# Patient Record
Sex: Female | Born: 1960 | Race: White | Hispanic: No | Smoking: Never smoker
Health system: Southern US, Community
[De-identification: ages and names within clinical notes are randomized; demographics above are authoritative.]

## PROBLEM LIST (undated history)

## (undated) DIAGNOSIS — M545 Low back pain, unspecified: Secondary | ICD-10-CM

## (undated) DIAGNOSIS — F32A Depression, unspecified: Secondary | ICD-10-CM

## (undated) DIAGNOSIS — J329 Chronic sinusitis, unspecified: Secondary | ICD-10-CM

## (undated) DIAGNOSIS — D219 Benign neoplasm of connective and other soft tissue, unspecified: Secondary | ICD-10-CM

## (undated) DIAGNOSIS — E7800 Pure hypercholesterolemia, unspecified: Secondary | ICD-10-CM

## (undated) DIAGNOSIS — F329 Major depressive disorder, single episode, unspecified: Secondary | ICD-10-CM

## (undated) DIAGNOSIS — R632 Polyphagia: Secondary | ICD-10-CM

## (undated) DIAGNOSIS — A64 Unspecified sexually transmitted disease: Secondary | ICD-10-CM

## (undated) DIAGNOSIS — N946 Dysmenorrhea, unspecified: Secondary | ICD-10-CM

## (undated) DIAGNOSIS — A6000 Herpesviral infection of urogenital system, unspecified: Secondary | ICD-10-CM

## (undated) DIAGNOSIS — N200 Calculus of kidney: Secondary | ICD-10-CM

## (undated) DIAGNOSIS — R102 Pelvic and perineal pain: Secondary | ICD-10-CM

## (undated) DIAGNOSIS — G4700 Insomnia, unspecified: Secondary | ICD-10-CM

## (undated) DIAGNOSIS — F419 Anxiety disorder, unspecified: Secondary | ICD-10-CM

## (undated) HISTORY — DX: Low back pain, unspecified: M54.50

## (undated) HISTORY — DX: Depression, unspecified: F32.A

## (undated) HISTORY — DX: Pure hypercholesterolemia, unspecified: E78.00

## (undated) HISTORY — DX: Insomnia, unspecified: G47.00

## (undated) HISTORY — DX: Herpesviral infection of urogenital system, unspecified: A60.00

## (undated) HISTORY — DX: Chronic sinusitis, unspecified: J32.9

## (undated) HISTORY — DX: Dysmenorrhea, unspecified: N94.6

## (undated) HISTORY — DX: Benign neoplasm of connective and other soft tissue, unspecified: D21.9

## (undated) HISTORY — DX: Anxiety disorder, unspecified: F41.9

## (undated) HISTORY — DX: Polyphagia: R63.2

## (undated) HISTORY — DX: Pelvic and perineal pain: R10.2

## (undated) HISTORY — DX: Calculus of kidney: N20.0

## (undated) HISTORY — DX: Unspecified sexually transmitted disease: A64

---
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
## 1979-08-22 DIAGNOSIS — A64 Unspecified sexually transmitted disease: Secondary | ICD-10-CM | POA: Insufficient documentation

## 1979-08-22 DIAGNOSIS — A6000 Herpesviral infection of urogenital system, unspecified: Secondary | ICD-10-CM | POA: Insufficient documentation

## 1979-08-22 HISTORY — DX: Unspecified sexually transmitted disease: A64

## 1979-08-22 HISTORY — DX: Herpesviral infection of urogenital system, unspecified: A60.00

## 1996-08-21 HISTORY — PX: BREAST EXCISIONAL BIOPSY: SUR124

## 1996-09-21 HISTORY — PX: BREAST SURGERY: SHX581

## 1998-10-06 ENCOUNTER — Other Ambulatory Visit: Admission: RE | Admit: 1998-10-06 | Discharge: 1998-10-06 | Payer: Self-pay | Admitting: *Deleted

## 1999-08-22 DIAGNOSIS — N200 Calculus of kidney: Secondary | ICD-10-CM

## 1999-08-22 HISTORY — DX: Calculus of kidney: N20.0

## 1999-09-19 ENCOUNTER — Encounter: Payer: Self-pay | Admitting: Obstetrics and Gynecology

## 1999-09-19 ENCOUNTER — Ambulatory Visit (HOSPITAL_COMMUNITY): Admission: RE | Admit: 1999-09-19 | Discharge: 1999-09-19 | Payer: Self-pay | Admitting: Obstetrics and Gynecology

## 1999-11-17 ENCOUNTER — Other Ambulatory Visit: Admission: RE | Admit: 1999-11-17 | Discharge: 1999-11-17 | Payer: Self-pay | Admitting: *Deleted

## 2000-05-12 ENCOUNTER — Encounter: Payer: Self-pay | Admitting: Emergency Medicine

## 2000-05-12 ENCOUNTER — Emergency Department (HOSPITAL_COMMUNITY): Admission: EM | Admit: 2000-05-12 | Discharge: 2000-05-12 | Payer: Self-pay | Admitting: Emergency Medicine

## 2000-05-22 ENCOUNTER — Encounter: Admission: RE | Admit: 2000-05-22 | Discharge: 2000-05-22 | Payer: Self-pay | Admitting: Urology

## 2000-05-22 ENCOUNTER — Encounter: Payer: Self-pay | Admitting: Urology

## 2000-09-26 ENCOUNTER — Encounter: Payer: Self-pay | Admitting: *Deleted

## 2000-09-26 ENCOUNTER — Ambulatory Visit (HOSPITAL_COMMUNITY): Admission: RE | Admit: 2000-09-26 | Discharge: 2000-09-26 | Payer: Self-pay | Admitting: *Deleted

## 2000-12-05 ENCOUNTER — Other Ambulatory Visit: Admission: RE | Admit: 2000-12-05 | Discharge: 2000-12-05 | Payer: Self-pay | Admitting: *Deleted

## 2001-09-13 ENCOUNTER — Emergency Department (HOSPITAL_COMMUNITY): Admission: EM | Admit: 2001-09-13 | Discharge: 2001-09-13 | Payer: Self-pay | Admitting: Emergency Medicine

## 2002-02-12 ENCOUNTER — Other Ambulatory Visit: Admission: RE | Admit: 2002-02-12 | Discharge: 2002-02-12 | Payer: Self-pay | Admitting: Obstetrics and Gynecology

## 2003-07-10 ENCOUNTER — Ambulatory Visit (HOSPITAL_COMMUNITY): Admission: RE | Admit: 2003-07-10 | Discharge: 2003-07-10 | Payer: Self-pay | Admitting: Obstetrics and Gynecology

## 2005-10-27 ENCOUNTER — Ambulatory Visit (HOSPITAL_COMMUNITY): Admission: RE | Admit: 2005-10-27 | Discharge: 2005-10-27 | Payer: Self-pay | Admitting: Obstetrics and Gynecology

## 2005-10-31 ENCOUNTER — Other Ambulatory Visit: Admission: RE | Admit: 2005-10-31 | Discharge: 2005-10-31 | Payer: Self-pay | Admitting: Obstetrics and Gynecology

## 2007-09-05 ENCOUNTER — Ambulatory Visit (HOSPITAL_COMMUNITY): Admission: RE | Admit: 2007-09-05 | Discharge: 2007-09-05 | Payer: Self-pay | Admitting: Obstetrics and Gynecology

## 2007-09-06 ENCOUNTER — Other Ambulatory Visit: Admission: RE | Admit: 2007-09-06 | Discharge: 2007-09-06 | Payer: Self-pay | Admitting: Obstetrics and Gynecology

## 2008-09-07 ENCOUNTER — Other Ambulatory Visit: Admission: RE | Admit: 2008-09-07 | Discharge: 2008-09-07 | Payer: Self-pay | Admitting: Obstetrics and Gynecology

## 2010-11-29 ENCOUNTER — Other Ambulatory Visit: Payer: Self-pay | Admitting: Obstetrics and Gynecology

## 2010-11-29 DIAGNOSIS — Z1231 Encounter for screening mammogram for malignant neoplasm of breast: Secondary | ICD-10-CM

## 2010-12-08 ENCOUNTER — Ambulatory Visit (HOSPITAL_COMMUNITY)
Admission: RE | Admit: 2010-12-08 | Discharge: 2010-12-08 | Disposition: A | Discharge status: Home / Self Care | Payer: BLUE CROSS/BLUE SHIELD | Source: Ambulatory Visit | Attending: Obstetrics and Gynecology | Admitting: Obstetrics and Gynecology

## 2010-12-08 DIAGNOSIS — Z1231 Encounter for screening mammogram for malignant neoplasm of breast: Secondary | ICD-10-CM | POA: Insufficient documentation

## 2010-12-08 LAB — HM MAMMOGRAPHY: HM Mammogram: NEGATIVE

## 2011-05-22 HISTORY — PX: COLONOSCOPY: SHX174

## 2012-12-05 ENCOUNTER — Ambulatory Visit: Payer: Self-pay | Admitting: Nurse Practitioner

## 2012-12-17 ENCOUNTER — Encounter: Payer: Self-pay | Admitting: *Deleted

## 2012-12-19 ENCOUNTER — Ambulatory Visit (INDEPENDENT_AMBULATORY_CARE_PROVIDER_SITE_OTHER): Payer: BC Managed Care – PPO | Admitting: Nurse Practitioner

## 2012-12-19 ENCOUNTER — Encounter: Payer: Self-pay | Admitting: Nurse Practitioner

## 2012-12-19 ENCOUNTER — Other Ambulatory Visit: Payer: Self-pay

## 2012-12-19 VITALS — BP 116/68 | HR 64 | Ht 64.0 in | Wt 158.0 lb

## 2012-12-19 DIAGNOSIS — Z1231 Encounter for screening mammogram for malignant neoplasm of breast: Secondary | ICD-10-CM

## 2012-12-19 DIAGNOSIS — Z01419 Encounter for gynecological examination (general) (routine) without abnormal findings: Secondary | ICD-10-CM

## 2012-12-19 DIAGNOSIS — B009 Herpesviral infection, unspecified: Secondary | ICD-10-CM

## 2012-12-19 MED ORDER — ACYCLOVIR 400 MG PO TABS: 400.0000 mg | ORAL_TABLET | Freq: Two times a day (BID) | ORAL | Status: DC

## 2012-12-19 MED ORDER — DESOGESTREL-ETHINYL ESTRADIOL 0.15-0.02/0.01 MG (21/5) PO TABS: 1.0000 | ORAL_TABLET | Freq: Every day | ORAL | Status: DC

## 2012-12-19 NOTE — Progress Notes (Signed)
52 y.o. Married- Separated Caucasian Fe here for annual exam.  Menses is usually 5 days and light. No cramps on OCP. She missed 3 pills this past pack and decided to stop OCP.  Waiting on next menses to restart OCP.  If no menses to call back. Still a lot of stress, divorce is pending. No new partner.  Patient's last menstrual period was 10/25/2012.          Sexually active: yes  The current method of family planning is OCP (estrogen/progesterone).    Exercising: yes  Home exercise routine includes walking 3 hrs per week. Smoker:  no  Health Maintenance: Pap:  11/30/2011 normal with Neg HR HPV MMG:  12/08/2010 pt. Will schedule Colonoscopy:  05/2011 normal recheck in 10 years. [facility] [place] BMD:   never Tdap:  2006 documented, and maybe repeated since then. Labs: PCP does UA and blood work    reports that she has never smoked. She has never used smokeless tobacco. She reports that she does not use illicit drugs.  Past Medical History  Diagnosis Date  . Dysmenorrhea   . 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 Pelvic pain   . Kidney stones 2001    Past Surgical History  Procedure Laterality Date  . 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 Colonoscopy  05/2011    Current Outpatient Prescriptions  Medication Sig Dispense Refill  . acyclovir (ZOVIRAX) 400 MG tablet Take 400 mg by mouth 2 (two) times daily.      . Cholecalciferol (VITAMIN D) 1000 UNITS capsule Take 1,000 Units by mouth daily.      Marland Kitchen desogestrel-ethinyl estradiol (KARIVA) 0.15-0.02/0.01 MG (21/5) tablet Take 1 tablet by mouth daily.      . Flaxseed, Linseed, 1000 MG CAPS Take by mouth daily.      . Multiple Vitamins-Minerals (MULTIVITAMIN PO) Take by mouth daily.      . Omega-3 Fatty Acids (OMEGA 3 PO) Take by mouth daily.      Marland Kitchen aspirin 81 MG tablet Take 81 mg by mouth daily.      . sertraline (ZOLOFT) 50 MG tablet Take 50 mg by mouth every other day.      . zolpidem (AMBIEN)  10 MG tablet Take 10 mg by mouth at bedtime as needed for sleep.       No current facility-administered medications for this visit.    Family History  Problem Relation Age of Onset  . Diabetes Mother   . Heart failure Father   . Diabetes Maternal Grandmother     ROS:  Pertinent items are noted in HPI.  Otherwise, a comprehensive ROS was negative.  Exam:   BP 116/68  Pulse 64  Ht 5\' 4"  (1.626 m)  Wt 158 lb (71.668 kg)  BMI 27.11 kg/m2  LMP 10/25/2012 Height: 5\' 4"  (162.6 cm)  Ht Readings from Last 3 Encounters:  12/19/12 5\' 4"  (1.626 m)    General appearance: alert, cooperative and appears stated age Head: Normocephalic, without obvious abnormality, atraumatic Neck: no adenopathy, supple, symmetrical, trachea midline and thyroid normal to inspection and palpation Lungs: clear to auscultation bilaterally Breasts: normal appearance, no masses or tenderness Heart: regular rate and rhythm Abdomen: soft, non-tender; no masses,  no organomegaly Extremities: extremities normal, atraumatic, no cyanosis or edema Skin: Skin color, texture, turgor normal. No rashes or lesions Lymph nodes: Cervical, supraclavicular, and axillary nodes normal. No abnormal inguinal nodes palpated Neurologic: Grossly normal  Pelvic: External genitalia:  no lesions              Urethra:  normal appearing urethra with no masses, tenderness or lesions              Bartholin's and Skene's: normal                 Vagina: normal appearing vagina with normal color and discharge, no lesions              Cervix: anteverted              Pap taken: no Bimanual Exam:  Uterus:  normal size, contour, position, consistency, mobility, non-tender              Adnexa: no mass, fullness, tenderness               Rectovaginal: Confirms               Anus:  normal sphincter tone, no lesions  A:  Well Woman with normal exam  History of Dysmenorrhea - on oral contraceptives  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

## 2012-12-19 NOTE — Patient Instructions (Addendum)

## 2012-12-22 NOTE — Progress Notes (Signed)
Encounter reviewed by Dr. Aruna Nestler Silva.  

## 2013-01-29 ENCOUNTER — Ambulatory Visit
Admission: RE | Admit: 2013-01-29 | Discharge: 2013-01-29 | Disposition: A | Discharge status: Home / Self Care | Payer: BC Managed Care – PPO | Source: Ambulatory Visit

## 2013-01-29 DIAGNOSIS — Z1231 Encounter for screening mammogram for malignant neoplasm of breast: Secondary | ICD-10-CM

## 2013-12-01 ENCOUNTER — Telehealth: Payer: Self-pay | Admitting: Nurse Practitioner

## 2013-12-01 NOTE — Telephone Encounter (Signed)
This is the year at AEX that she would be taken off OCP secondary to her age.  At this point she can come off and if no menses - which is expected we would document that.  Then in 3 months if no menses we would do a Provera challenge.  If in the interim off OCP if she gets vaso symptoms (that OTC [place] would not help) then may need discussion about HRT. Not sure how she feels about HRT.  But we have to have her off OCP before lab test would be accurate to test for menopause.

## 2013-12-01 NOTE — Telephone Encounter (Signed)
Routed to triage 

## 2013-12-01 NOTE — Telephone Encounter (Signed)
Patty, would you like me to offer this patient an office visit with you to discuss continuation of birth control prior to any further refills past annual exam?

## 2013-12-01 NOTE — Telephone Encounter (Signed)
Shelby Barnes--patient is requesting another RX for refill to last three months past her aex due date. She is getting ready to switch jobs and will not have insurance for three months. The patient is due after 12/19/13 but will not have insurance at that time. Please advise?  [facility] [address] [facility] [address]

## 2013-12-02 NOTE — Telephone Encounter (Signed)
Spoke with patient and she is agreeable to plan. She will finish the tablets that she has at this time and then dc. She will call with menses or if no menses in next 3 months, then schedule AEX with Patty.  Routing to provider for final review. Patient agreeable to disposition. Will close encounter

## 2013-12-22 ENCOUNTER — Ambulatory Visit: Payer: BC Managed Care – PPO | Admitting: Nurse Practitioner

## 2014-04-06 ENCOUNTER — Encounter: Payer: Self-pay | Admitting: Nurse Practitioner

## 2014-04-06 ENCOUNTER — Ambulatory Visit (INDEPENDENT_AMBULATORY_CARE_PROVIDER_SITE_OTHER): Payer: BC Managed Care – PPO | Admitting: Nurse Practitioner

## 2014-04-06 VITALS — BP 128/82 | HR 60 | Ht 64.25 in | Wt 182.0 lb

## 2014-04-06 DIAGNOSIS — Z0000 Encounter for general adult medical examination without abnormal findings: Secondary | ICD-10-CM

## 2014-04-06 DIAGNOSIS — N912 Amenorrhea, unspecified: Secondary | ICD-10-CM

## 2014-04-06 DIAGNOSIS — B009 Herpesviral infection, unspecified: Secondary | ICD-10-CM

## 2014-04-06 DIAGNOSIS — Z01419 Encounter for gynecological examination (general) (routine) without abnormal findings: Secondary | ICD-10-CM

## 2014-04-06 DIAGNOSIS — N3000 Acute cystitis without hematuria: Secondary | ICD-10-CM

## 2014-04-06 LAB — POCT URINALYSIS DIPSTICK
BILIRUBIN UA: NEGATIVE
Glucose, UA: NEGATIVE
KETONES UA: NEGATIVE
NITRITE UA: NEGATIVE
PH UA: 6
Protein, UA: NEGATIVE
RBC UA: NEGATIVE
Urobilinogen, UA: NEGATIVE

## 2014-04-06 MED ORDER — MEDROXYPROGESTERONE ACETATE 10 MG PO TABS: 10.0000 mg | ORAL_TABLET | Freq: Every day | ORAL | Status: DC

## 2014-04-06 MED ORDER — NITROFURANTOIN MONOHYD MACRO 100 MG PO CAPS: 100.0000 mg | ORAL_CAPSULE | Freq: Two times a day (BID) | ORAL | Status: DC

## 2014-04-06 MED ORDER — ACYCLOVIR 400 MG PO TABS: 400.0000 mg | ORAL_TABLET | Freq: Two times a day (BID) | ORAL | Status: DC

## 2014-04-06 NOTE — Patient Instructions (Signed)

## 2014-04-06 NOTE — Progress Notes (Signed)
53 y.o. G0P0 Divorced Caucasian Fe here for annual exam.  She went off OCP in May secondary to age and loss of insurance. No menses since then.  She does have an intermittent right mid abdomen to flank pain.  She initially thought this may be related to menses or right renal calculi.  She has had history of right renal calculi in the past.   But she also has a history of right uterine  fibroid found on PUS 1995.  No GI symptoms.  No vaso symptoms. Going out with friends. Since the divorce she has been more upset and therefore more outbreaks of HSV.  She also has a new job but she really like her position.  Patient's last menstrual period was 12/19/2013.          Sexually active: No.  The current method of family planning is none.    Exercising: Yes.    Home exercise routine includes yoga. Smoker:  no  Health Maintenance: Pap:  11/30/2011 normal with HR HPV MMG:  01/29/2013 BI-Rads neg  Will schedule Colonoscopy:  05/2011 WNL recheck in 10 years TDaP:  2 year per pt (PCP) Labs: PCP    UA: WBC Large ph: 6.0   reports that she has never smoked. She has never used smokeless tobacco. She reports that she does not use illicit drugs.  Past Medical History  Diagnosis Date  . Dysmenorrhea   . 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 Pelvic pain   . Kidney stones 2001  . Genital HSV 1981    Past Surgical History  Procedure Laterality Date  . Colonoscopy  05/2011  . 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 acyclovir (ZOVIRAX) 400 MG tablet Take 1 tablet (400 mg total) by mouth 2 (two) times daily.  180 tablet  3  . aspirin 81 MG tablet Take 81 mg by mouth daily.      . Cholecalciferol (VITAMIN D) 1000 UNITS capsule Take 1,000 Units by mouth daily.      . Omega-3 Fatty Acids (OMEGA 3 PO) Take by mouth daily.      . medroxyPROGESTERone (PROVERA) 10 MG tablet Take 1 tablet (10 mg total) by mouth daily.  10  tablet  0  . nitrofurantoin, macrocrystal-monohydrate, (MACROBID) 100 MG capsule Take 1 capsule (100 mg total) by mouth 2 (two) times daily.  14 capsule  0   No current facility-administered medications for this visit.    Family History  Problem Relation Age of Onset  . Diabetes Mother 79    Whipple Procedure from cyst on pancrease  . Heart failure Father   . Diabetes Maternal Grandmother   . Breast cancer Maternal Aunt 70    with metasis  . Stroke Paternal Grandmother   . Heart failure Paternal Grandfather     ROS:  Pertinent items are noted in HPI.  Otherwise, a comprehensive ROS was negative.  Exam:   BP 128/82  Pulse 60  Ht 5' 4.25" (1.632 m)  Wt 182 lb (82.555 kg)  BMI 31.00 kg/m2  LMP 12/19/2013 Height: 5' 4.25" (163.2 cm)  Ht Readings from Last 3 Encounters:  04/06/14 5' 4.25" (1.632 m)  12/19/12 5\' 4"  (1.626 m)    General appearance: alert, cooperative and appears stated age Head: Normocephalic, without obvious abnormality, atraumatic Neck: no adenopathy, supple, symmetrical, trachea midline and thyroid normal to inspection and palpation Lungs: clear  to auscultation bilaterally Breasts: normal appearance, no masses or tenderness Heart: regular rate and rhythm Abdomen: soft, non-tender; no masses,  no organomegaly Extremities: extremities normal, atraumatic, no cyanosis or edema Skin: Skin color, texture, turgor normal. No rashes or lesions Lymph nodes: Cervical, supraclavicular, and axillary nodes normal. No abnormal inguinal nodes palpated Neurologic: Grossly normal   Pelvic: External genitalia:  no lesions              Urethra:  normal appearing urethra with no masses, tenderness or lesions              Bartholin's and Skene's: normal                 Vagina: normal appearing vagina with normal color and discharge, no lesions              Cervix: anteverted              Pap taken: No. Bimanual Exam:  Uterus:  normal size, contour, position, consistency,  mobility, non-tender              Adnexa: no mass, fullness, tenderness               Rectovaginal: Confirms               Anus:  normal sphincter tone, no lesions  A:  Well Woman with normal exam  Postmenopausal ?  Right abdomen pain with abnormal urine  - R/O UTI as the cause  History of renal calculi and uterine fibroid  History of HSV  P:   Reviewed health and wellness pertinent to exam  Pap smear not taken today  Mammogram is due now and will schedule  Will follow with urine culture  Will for a Provera challenge 10 mg for 10 days and to report any bleeding  Will check an [facility] and follow  If the right abdominal pain does not go away and persist - may get PUS to evaluate  Counseled on breast self exam, mammography screening, adequate intake of calcium and vitamin D, diet and exercise return annually or prn  An After Visit Summary was printed and given to the patient.

## 2014-04-07 ENCOUNTER — Telehealth: Payer: Self-pay

## 2014-04-07 LAB — URINE CULTURE: Colony Count: 40000

## 2014-04-07 LAB — URINALYSIS, MICROSCOPIC ONLY
BACTERIA UA: NONE SEEN
Casts: NONE SEEN
Crystals: NONE SEEN

## 2014-04-07 NOTE — Telephone Encounter (Signed)
Pt informed and will call the office if she has any bleeding Encounter closed

## 2014-04-07 NOTE — Telephone Encounter (Signed)
Pt left appt yesterday with doing her lab work. Called pt to let her know. Pt is getting an [place] done but already took one pill last night. Per Ms. Shelby Barnes, pt will do lab work after she finish rx and if there is any break through bleeding.   LMOM for pt to call back.

## 2014-04-08 ENCOUNTER — Telehealth: Payer: Self-pay

## 2014-04-08 NOTE — Telephone Encounter (Signed)
Message copied by Gerda Diss on Wed Apr 08, 2014 11:14 AM ------      Message from: Kem Boroughs R      Created: Tue Apr 07, 2014 11:28 PM       Let patient know that urine culture had multiple bacteria that suggest vaginal contamination.  Continue to monitor symptoms and if any symptoms persist to call back. ------

## 2014-04-08 NOTE — Telephone Encounter (Signed)
Pt informed of results and voiced understanding Encounter closed

## 2014-04-12 NOTE — Progress Notes (Signed)
Encounter reviewed by Dr. Brook Silva.  

## 2014-05-18 ENCOUNTER — Ambulatory Visit (HOSPITAL_COMMUNITY)
Admission: RE | Admit: 2014-05-18 | Discharge: 2014-05-18 | Disposition: A | Discharge status: Home / Self Care | Payer: BC Managed Care – PPO | Source: Ambulatory Visit | Attending: Internal Medicine | Admitting: Internal Medicine

## 2014-05-18 ENCOUNTER — Encounter (HOSPITAL_COMMUNITY): Payer: Self-pay

## 2014-05-18 ENCOUNTER — Other Ambulatory Visit (HOSPITAL_COMMUNITY): Payer: Self-pay | Admitting: Internal Medicine

## 2014-05-18 DIAGNOSIS — R509 Fever, unspecified: Secondary | ICD-10-CM

## 2014-05-18 DIAGNOSIS — R109 Unspecified abdominal pain: Secondary | ICD-10-CM

## 2014-05-18 DIAGNOSIS — R1031 Right lower quadrant pain: Secondary | ICD-10-CM | POA: Insufficient documentation

## 2014-05-18 DIAGNOSIS — R10813 Right lower quadrant abdominal tenderness: Secondary | ICD-10-CM

## 2014-05-18 MED ORDER — IOHEXOL 300 MG/ML  SOLN
80.0000 mL | Freq: Once | INTRAMUSCULAR | Status: AC | PRN
  Administered 2014-05-18: 80 mL via INTRAVENOUS

## 2014-05-20 ENCOUNTER — Telehealth: Payer: Self-pay | Admitting: Nurse Practitioner

## 2014-05-20 NOTE — Telephone Encounter (Signed)
Spoke with patient. Patient states that she was seen last month for her aex and had a UTI. Patient was having slight pain on her right side when she came in for that appointment. States that she had her urine checked at PCP and still had UTI was placed on Cipro. Patient has been having increased right sided pain. Was seen on Monday for CT scan which only showed fibroid which patient states she has had for a while. Denies any bleeding. Patient states that pain is intermittent and feels more like an "intense pressure." Patient was previously on OCP but was taken off at last aex. Patient would like to know if this could be causing pain. Advised will need to be seen in office for evaluation so further recommendations can be made. Patient is agreeable. Appointment scheduled for tomorrow at 10am with Regina Eck CNM. Patient agreeable to date and time. Patient aware if pain increases or symptoms change or increase will need to be seen for immediate care. Patient agreeable.  Regina Eck CNM  Cc: Milford Cage, FNP

## 2014-05-20 NOTE — Telephone Encounter (Signed)
Pt wants to talk with the nurse no information given. °

## 2014-05-21 ENCOUNTER — Ambulatory Visit (INDEPENDENT_AMBULATORY_CARE_PROVIDER_SITE_OTHER): Payer: BC Managed Care – PPO | Admitting: Certified Nurse Midwife

## 2014-05-21 ENCOUNTER — Encounter: Payer: Self-pay | Admitting: Certified Nurse Midwife

## 2014-05-21 VITALS — BP 110/70 | HR 72 | Temp 98.2°F | Resp 16 | Ht 64.25 in | Wt 178.0 lb

## 2014-05-21 DIAGNOSIS — Z8742 Personal history of other diseases of the female genital tract: Secondary | ICD-10-CM

## 2014-05-21 DIAGNOSIS — N390 Urinary tract infection, site not specified: Secondary | ICD-10-CM

## 2014-05-21 DIAGNOSIS — Z86018 Personal history of other benign neoplasm: Secondary | ICD-10-CM

## 2014-05-21 DIAGNOSIS — R102 Pelvic and perineal pain: Secondary | ICD-10-CM

## 2014-05-21 DIAGNOSIS — N852 Hypertrophy of uterus: Secondary | ICD-10-CM

## 2014-05-21 LAB — POCT URINALYSIS DIPSTICK
Bilirubin, UA: NEGATIVE
Blood, UA: NEGATIVE
GLUCOSE UA: NEGATIVE
Ketones, UA: NEGATIVE
Leukocytes, UA: NEGATIVE
Nitrite, UA: NEGATIVE
Protein, UA: NEGATIVE
Urobilinogen, UA: NEGATIVE
pH, UA: 5

## 2014-05-21 NOTE — Patient Instructions (Signed)
Fibroids Fibroids are lumps (tumors) that can occur any place in a woman's body. These lumps are not cancerous. Fibroids vary in size, weight, and where they grow. HOME CARE  Do not take aspirin.  Write down the number of pads or tampons you use during your period. Tell your doctor. This can help determine the best treatment for you. GET HELP RIGHT AWAY IF:  You have pain in your lower belly (abdomen) that is not helped with medicine.  You have cramps that are not helped with medicine.  You have more bleeding between or during your period.  You feel lightheaded or pass out (faint).  Your lower belly pain gets worse. MAKE SURE YOU:  Understand these instructions.  Will watch your condition.  Will get help right away if you are not doing well or get worse. Document Released: 09/09/2010 Document Revised: 10/30/2011 Document Reviewed: 09/09/2010 [facility] Patient Information 2015 [place], [place]. This information is not intended to replace advice given to you by your health care provider. Make sure you discuss any questions you have with your health care provider. Abdominal Pain Many things can cause abdominal pain. Usually, abdominal pain is not caused by a disease and will improve without treatment. It can often be observed and treated at home. Your health care provider will do a physical exam and possibly order blood tests and X-rays to help determine the seriousness of your pain. However, in many cases, more time must pass before a clear cause of the pain can be found. Before that point, your health care provider may not know if you need more testing or further treatment. HOME CARE INSTRUCTIONS  Monitor your abdominal pain for any changes. The following actions may help to alleviate any discomfort you are experiencing:  Only take over-the-counter or prescription medicines as directed by your health care provider.  Do not take laxatives unless directed to do so by your health care  provider.  Try a clear liquid diet (broth, tea, or water) as directed by your health care provider. Slowly move to a bland diet as tolerated. SEEK MEDICAL CARE IF:  You have unexplained abdominal pain.  You have abdominal pain associated with nausea or diarrhea.  You have pain when you urinate or have a bowel movement.  You experience abdominal pain that wakes you in the night.  You have abdominal pain that is worsened or improved by eating food.  You have abdominal pain that is worsened with eating fatty foods.  You have a fever. SEEK IMMEDIATE MEDICAL CARE IF:   Your pain does not go away within 2 hours.  You keep throwing up (vomiting).  Your pain is felt only in portions of the abdomen, such as the right side or the left lower portion of the abdomen.  You pass bloody or black tarry stools. MAKE SURE YOU:  Understand these instructions.   Will watch your condition.   Will get help right away if you are not doing well or get worse.  Document Released: 05/17/2005 Document Revised: 08/12/2013 Document Reviewed: 04/16/2013 [facility] Patient Information 2015 [place], [place]. This information is not intended to replace advice given to you by your health care provider. Make sure you discuss any questions you have with your health care provider.

## 2014-05-21 NOTE — Progress Notes (Signed)
53 y.o. Divorced white female  G0P0 here for complaint of pelvic pressure and pain that started 5 days was seen on Monday by PCP and treated for UTI with Cipro and was seen again 05/20/14  by PCP and had CT scan which only showed known fibroid. Continues to have pain in area of right. Patient also had blood work with PCP all normal except potassium.  Pain is primarily located right mid quadrant and pain is felt more with bending. and is described as off and on. Pressure pain has almost resolved since starting Cipro..  Pain is aggravated by nothing and is not associated with anything else. Patient has been taking Metamucil to help with stool regulation, but no problems with. Patient also stopped her OCP's 4 months ago and feels this may have contributed also but no cramping experienced. Denies any vaginal bleeding or vaginal dryness.  Patient is not sexually active.   ROS:  Nausea:  No.  Fever:  No., but had a fever 5 days ago prior to PCP visit, none since  Weight loss/gain:  no  Vaginal discharge or odor:  no  Other:  Back pain earlier in the week, none now  All other ROS questions are negative except as per HPI.  Exam:   BP 110/70  Pulse 72  Temp(Src) 98.2 F (36.8 C) (Oral)  Resp 16  Ht 5' 4.25" (1.632 m)  Wt 178 lb (80.74 kg)  BMI 30.31 kg/m2  LMP 12/19/2013 General appearance: alert, cooperative, appears stated age and no distress Skin: warn and dry CVAT: negative Abdomen:  soft, non-tender; bowel sounds normal; no masses,  no organomegaly and no rebound, or point pain noted negative suprapubic Lymph:  no enlarged inguinal lymph nodes   Pelvic: External genitalia:  no lesions, no redness or tenderness              Urethra: normal appearing urethra with no masses, tenderness or lesions              Bartholins and Skenes: Bartholin's, Urethra, Skene's normal                 Vagina: normal appearing vagina with normal color and discharge, no lesions, non tender              Cervix:  normal appearance and non tender              Pap taken: No. Bimanual Exam:  Uterus:  enlarged to 10 week's size, non tender, tilted to left                               Adnexa:    normal adnexa in size, nontender and no masses, and left not palpated well                               Rectovaginal: Confirms                               Anus: normal appearance Declines POCT urine    A: UTI under treatment with Cipro per PCP CT scan within past 24 hours, negative except for uterine fibroid Normal abdominal exam, no point of pain identified  Enlarged uterus known fibroid  P:Discussed findings with patient and feel  UTI the initial problem due to pressure and pain has decreased  and negative CT. Would still recommend PUS to assess fibroid and adnexa. Dr. Quincy Simmonds agrees with this plan, per consult. Warning signs of pelvic and abdominal pain given and need to advise. Patient feels this is a good plan and will advise if any change in status.   Rv prn, as above    An After Visit Summary was printed and given to the patient.

## 2014-05-24 NOTE — Progress Notes (Signed)
Encounter reviewed by Dr. Brook Silva.  

## 2014-05-26 ENCOUNTER — Telehealth: Payer: Self-pay | Admitting: Certified Nurse Midwife

## 2014-05-26 DIAGNOSIS — N852 Hypertrophy of uterus: Secondary | ICD-10-CM

## 2014-05-26 DIAGNOSIS — D259 Leiomyoma of uterus, unspecified: Secondary | ICD-10-CM

## 2014-05-26 NOTE — Telephone Encounter (Signed)
Spoke with patient. Advised that per benefit quote received, she will be responsible for $465.38 when she comes in for PUS.  Patient asked "is there any way that this can be coded as screening because I believe if it's coded as screening my insurance company will cover the costs" I checked with Verline Lema and told the patient that  since they are doing it to assess her fibroid and adnexa due to pain...then it would not be coded as screening.  Patient wants to know if she can pay for the procedure in small increments. States that she has been paying oop for medical procedures (i.e CT scan, etc) and has tapped out. I advised that it is our office policy to collect payment at the time of service. Patient states that she cannot pay upfront, but that she can pay $100 at the time of service and then pay the remaining $365.38 off at $50 per month.  I advised that I would notify administration of her request and that administration will make the determination and contact her directly.

## 2014-06-04 NOTE — Telephone Encounter (Signed)
Call to patient. States UTI resolved, has seen PCP. That pain is better but pelvic pain and pressure continues. Discussed that PUS is needed to further evaluate fibroids and determine what/if any treatment options she may be interested in. Cost is being applied to $5000 deductible that has not been met for this year, her deductible will start over 08/21/14 so she can consider if she feels she wants to wait till new deductible year for evaluation and/or treatment, depending on pain and MD review. Also offered to do 90 day payrment arrangement for PUS here or schedule PUS at hospital (although hospital cost will be higher but they are more flexible with payment). Patient states she can do 90 day payment plan but will likely wait till January for any treatment. Advised I would recommend proceed with PUS now if she can to at least let MD complete evaluation and make a plan. Patient agreeable. PUS scheduled for 06-11-14 at 130 with Dr Sabra Heck (based on patient's schedule preference) Sabrina, please note account for payment arrangement.  Routing to provider for final review. Patient agreeable to disposition. Will close encounter.

## 2014-06-04 NOTE — Telephone Encounter (Signed)
fyi

## 2014-06-04 NOTE — Telephone Encounter (Signed)
Per discussion with Shelby Barnes, we will ask patient to pay $155.12 at the time of service. She will then pay $155.13 by Nov 22 and then again by Dec 22 to pay off her total patient liability of $465.38. Patient is to sign promissory note at appt 10.22.2015.

## 2014-06-04 NOTE — Addendum Note (Signed)
Addended by: Michele Mcalpine on: 06/04/2014 02:18 PM   Modules accepted: Orders

## 2014-06-11 ENCOUNTER — Ambulatory Visit (INDEPENDENT_AMBULATORY_CARE_PROVIDER_SITE_OTHER): Payer: BC Managed Care – PPO

## 2014-06-11 ENCOUNTER — Ambulatory Visit (INDEPENDENT_AMBULATORY_CARE_PROVIDER_SITE_OTHER): Payer: BC Managed Care – PPO | Admitting: Obstetrics & Gynecology

## 2014-06-11 VITALS — BP 122/74 | Resp 16 | Ht 64.25 in | Wt 176.0 lb

## 2014-06-11 DIAGNOSIS — D259 Leiomyoma of uterus, unspecified: Secondary | ICD-10-CM

## 2014-06-11 DIAGNOSIS — R1031 Right lower quadrant pain: Secondary | ICD-10-CM

## 2014-06-11 DIAGNOSIS — D252 Subserosal leiomyoma of uterus: Secondary | ICD-10-CM

## 2014-06-11 DIAGNOSIS — N852 Hypertrophy of uterus: Secondary | ICD-10-CM

## 2014-06-11 DIAGNOSIS — N912 Amenorrhea, unspecified: Secondary | ICD-10-CM

## 2014-06-11 NOTE — Progress Notes (Signed)
53 y.o. G0P0 Divorced White female here for a pelvic ultrasound due to pelvic pain that started in late September.  Pt was diagnosed with a UTI and was treated appropriately.   Pt reports pain is mostly an ache but has been persistent since then.  Does not feel musculoskeletal to the patient.    Patient's last menstrual period was 12/19/2013. Sexually active:  no  Contraception: no method  FINDINGS: UTERUS: 6.2 x 5.0 x 5.9cm with left fibroid 3.2cm (appears to be possibly 2 fibroids clustered together) located down near cervix EMS: 6.74mm, slightly echogenic ADNEXA:   Left ovary 2.0 x 2.1 x 5.7QI with 6.9GE follicle   Right ovary 2.8 x 2.2 x 9.5MW with 4.1LK follicle CUL DE SAC:  No free fluid  D/w pt findings showing uterine fibroid (fibroid cluster) off to the left near the cervix, which is not where her pain is located.  She does have an almost 2cm follicle on the right side.  Ovaries and endometrium do not appear to be post-menopausal.  Pt and I discussed this finding.  I recommended testing FSH and if not clearly menopausal, will plan Provera challenge.  Pt in agreement with plan.    Assessment:  RLQ dull achy pain that started with onset of UTI symptoms.  (Urine micro 05/21/14)  Plan: [place].  If not clearly menopausal, will recommend Provera challenge.  May need repeat PUS to recheck fibroids.  ~20 minutes spent with patient >50% of time was in face to face discussion of above.

## 2014-06-12 LAB — FOLLICLE STIMULATING HORMONE: FSH: 23.7 m[IU]/mL

## 2014-06-14 ENCOUNTER — Encounter: Payer: Self-pay | Admitting: Obstetrics & Gynecology

## 2014-06-14 DIAGNOSIS — D252 Subserosal leiomyoma of uterus: Secondary | ICD-10-CM | POA: Insufficient documentation

## 2014-06-14 DIAGNOSIS — N912 Amenorrhea, unspecified: Secondary | ICD-10-CM | POA: Insufficient documentation

## 2014-06-14 DIAGNOSIS — R1031 Right lower quadrant pain: Secondary | ICD-10-CM | POA: Insufficient documentation

## 2014-06-14 HISTORY — DX: Subserosal leiomyoma of uterus: D25.2

## 2014-06-15 ENCOUNTER — Telehealth: Payer: Self-pay

## 2014-06-15 MED ORDER — MEDROXYPROGESTERONE ACETATE 10 MG PO TABS: ORAL_TABLET | ORAL | Status: DC

## 2014-06-15 NOTE — Telephone Encounter (Signed)
Message copied by Robley Fries on Mon Jun 15, 2014  8:41 AM ------      Message from: Megan Salon      Created: Sun Jun 14, 2014  9:58 PM       Inform level is 24.  This isn't full menopausal level.  Recommend Provera 10mg  x 10 days.  I think she will have a cycle with this.  May take up to two weeks.  She needs to call to report if does bleed or if she doesn't bleed. ------

## 2014-06-15 NOTE — Telephone Encounter (Signed)
Patient notified of results. Aware Provera Rx sent to pharmacy, will start this today and call with update. States has had pelvic pain and uncomfortable feeling x 4 months, increased yesterday. Aware I will let Dr Sabra Heck know and if we need to do anything else I will call her back. Please Shelby Barnes will call if bleeding starts or if no bleeding about 1-2 weeks after Provera//kn

## 2014-06-19 NOTE — Telephone Encounter (Signed)
I'd like to see what the Provera does first.  Thanks.  Encounter closed.

## 2014-07-03 ENCOUNTER — Telehealth: Payer: Self-pay | Admitting: Obstetrics & Gynecology

## 2014-07-03 NOTE — Telephone Encounter (Signed)
Return call to patient, LMTCB.  

## 2014-07-03 NOTE — Telephone Encounter (Signed)
Patient calling to give update on "10 day doses of hormones." She did have a menstrual cycle and reports "the pain now is not bad as it was." Patient unsure of what she needs to do next.  [place],  - [place]

## 2014-07-06 NOTE — Telephone Encounter (Signed)
Return call to patient. States she started menses on last day of progesterone pills. Was heavier and more cramps than normal but is aware that was to be expected. Right sided pain is not resolved but is much better. Previously was pain scale of 6/10 now is 2/10. Not sure what next step is. She is willing to restart OCP to prevent this from happening again. Advised Dr Sabra Heck will review and we will call her back.

## 2014-07-06 NOTE — Telephone Encounter (Signed)
Patient returning call.

## 2014-07-08 MED ORDER — NORETHINDRONE 0.35 MG PO TABS: 1.0000 | ORAL_TABLET | Freq: Every day | ORAL | Status: DC

## 2014-07-08 NOTE — Telephone Encounter (Signed)
Yes.  May have some irregular bleeding but, yes, start now.

## 2014-07-08 NOTE — Telephone Encounter (Signed)
Dr. Sabra Heck, okay for patient to start micronor at any time?  Spoke with patient. Message from Dr. Sabra Heck given.  Rx placed for 3 months and follow up appointment given. Patient feels she may not start cycle again, after this last cycle with provera. Advised can start Micronor now and take 1 pill each day.  Patient does not need contraception.  Advised if different instructions, would return call with message from Dr. Sabra Heck.

## 2014-07-08 NOTE — Telephone Encounter (Signed)
I think she should start micronor.  Safe due to no estrogen and should make cycles light, maybe not at all.  Give RX for three months and then I'd like to see her back.  Thanks.

## 2014-08-03 ENCOUNTER — Telehealth: Payer: Self-pay | Admitting: Obstetrics & Gynecology

## 2014-08-03 NOTE — Telephone Encounter (Signed)
S/w patient she had a question in regards to her Ortho Micronor, she said that the rx did not have any refills on it. It was only sent for 3 months patient was supposed to have f/u. Told patient that I sent this to Dr. Sabra Heck and she is aware that she has f/u scheduled for 10/08/14 and she sent in refills till then. Asked patient how has she been doing on it, she says she is doing well and has almost no pain now.  Routed to provider for review, encounter closed.

## 2014-08-03 NOTE — Telephone Encounter (Signed)
Pt has question about the hormone medication. Not sure if she should still be taking.

## 2014-08-03 NOTE — Telephone Encounter (Signed)
Medication refill request: Sharobel 0.35 mg (generic ortho micronor) Last AEX: 04/06/14 with Ms. Patty Next AEX: 04/15/15 with Ms. Patty Last MMG (if hormonal medication request): 01/29/13 Bi-Rads 1: Negative Refill authorized: #28/2 rfs  Patient was put on Ortho Micronor 07/03/14 #1/3 rfs was sent and patient was supposed to have a follow up appointment. Patient is scheduled for f/u 10/08/14, please advise.

## 2014-10-08 ENCOUNTER — Ambulatory Visit (INDEPENDENT_AMBULATORY_CARE_PROVIDER_SITE_OTHER): Payer: No Typology Code available for payment source | Admitting: Obstetrics & Gynecology

## 2014-10-08 ENCOUNTER — Encounter: Payer: Self-pay | Admitting: Obstetrics & Gynecology

## 2014-10-08 VITALS — BP 116/78 | HR 68 | Resp 16 | Wt 179.8 lb

## 2014-10-08 DIAGNOSIS — N938 Other specified abnormal uterine and vaginal bleeding: Secondary | ICD-10-CM

## 2014-10-08 DIAGNOSIS — D252 Subserosal leiomyoma of uterus: Secondary | ICD-10-CM

## 2014-10-08 DIAGNOSIS — R1031 Right lower quadrant pain: Secondary | ICD-10-CM

## 2014-10-08 MED ORDER — NORETHINDRONE 0.35 MG PO TABS: 1.0000 | ORAL_TABLET | Freq: Every day | ORAL | Status: DC

## 2014-10-08 NOTE — Progress Notes (Signed)
Patient ID: Shelby Barnes, female   DOB: [birth year], 54 y.o.   MRN: [id number]   54 yo G0 DWF here for follow up after starting micronor.  Pt see in October for ultrasound due to RLQ pain.  Ultrasound showed a cluster of fibroids located down near the cervix.  Pt has known about having fibroids.  Also, ovarian follicles noted.  FSH was 23.  Provera challenge was initiated.  Pt did have a heavier but expected cycle after the provera.  Also, spotted some during first pack of pills.  This was light.  Since then, she has not had any bleeding.  Pain has completely resolved.  She feels that she has gained weight on the Micronor.  Reviewed with her it is only 3 pounds but that she may be having some progestational side effects.  Pt would like to stop Micronor and see how she feels off of it.  Going to [place] with parents and family friends so will continue until after that trip.   Pt aware she may have some intermittent bleeding off the Micronor.  Would recommend repeat [place] at next AEX.  Pt will call if has any bleeding or return of pain.  She is comfortable with plan and will call with any concerns.  Assessment:  Perimenopausal DUB, resolved with micronor RLQ pain that has resolved Cluster of fibroids measuring 3.2cm near cervix  Plan:  rx for micronor to pharmacy.  She will decide when wants to stop.  If decides to continue, will need RX. Pt knows to call with any return of pain or any bleeding.  Voices clear understanding.  ~15 minutes spent with patient >50% of time was in face to face discussion of above.

## 2015-01-12 ENCOUNTER — Other Ambulatory Visit: Payer: Self-pay | Admitting: Nurse Practitioner

## 2015-01-12 DIAGNOSIS — Z1231 Encounter for screening mammogram for malignant neoplasm of breast: Secondary | ICD-10-CM

## 2015-01-13 ENCOUNTER — Ambulatory Visit (HOSPITAL_COMMUNITY)
Admission: RE | Admit: 2015-01-13 | Discharge: 2015-01-13 | Disposition: A | Discharge status: Home / Self Care | Payer: PRIVATE HEALTH INSURANCE | Source: Ambulatory Visit | Attending: Nurse Practitioner | Admitting: Nurse Practitioner

## 2015-01-13 DIAGNOSIS — Z1231 Encounter for screening mammogram for malignant neoplasm of breast: Secondary | ICD-10-CM | POA: Insufficient documentation

## 2015-04-14 ENCOUNTER — Encounter: Payer: Self-pay | Admitting: Nurse Practitioner

## 2015-04-14 ENCOUNTER — Ambulatory Visit (INDEPENDENT_AMBULATORY_CARE_PROVIDER_SITE_OTHER): Payer: No Typology Code available for payment source | Admitting: Nurse Practitioner

## 2015-04-14 VITALS — BP 100/66 | HR 76 | Ht 63.5 in | Wt 180.0 lb

## 2015-04-14 DIAGNOSIS — Z01419 Encounter for gynecological examination (general) (routine) without abnormal findings: Secondary | ICD-10-CM | POA: Diagnosis not present

## 2015-04-14 DIAGNOSIS — B009 Herpesviral infection, unspecified: Secondary | ICD-10-CM

## 2015-04-14 DIAGNOSIS — Z0000 Encounter for general adult medical examination without abnormal findings: Secondary | ICD-10-CM | POA: Diagnosis not present

## 2015-04-14 MED ORDER — ACYCLOVIR 400 MG PO TABS: 400.0000 mg | ORAL_TABLET | Freq: Two times a day (BID) | ORAL | Status: DC

## 2015-04-14 MED ORDER — MEDROXYPROGESTERONE ACETATE 10 MG PO TABS: 10.0000 mg | ORAL_TABLET | Freq: Every day | ORAL | Status: DC

## 2015-04-14 NOTE — Patient Instructions (Signed)

## 2015-04-14 NOTE — Progress Notes (Signed)
Patient ID: Shelby Barnes, female   DOB: 13-Mar-1961, 54 y.o.   MRN: [id number] 54 y.o. G0P0 Divorced  Caucasian Fe here for annual exam. She had stopped her OCP in May 2015 and by October was having right lower quadrant pain.  PCP did CT scan showing fibroids.  She was then seen here and PUS was done showing a cyst right ovary and fibroids.  She was given Provera challenge and had a heavier withdrawal bleed.   She was given POP and had very little spotting the first months and none after that.  For several months took POP and had amenorrhea.  Then stopped POP for a few months, pain again on the right side that was very slight.  Then took POP again for a month.  Now off POP 3-4 month.  No vaso symptoms.  Last FSH was 23.1 on 04/13/14.  Has had no recurrence of pain RLQ.  She is not dating or SA.  She went on a trip wit her mother and step father.  Mother had medical issues and had to be air vac out of the [place].  She has been with Hospice now for 6 months due to lung issues.  They reassess her yesterday and Hospice will stop and home health will come to assist her. Pt feels overwhelmed because she is 7 hours away and not easily available to help care for her mother.  Patient's last menstrual period was 07/21/2014.          Sexually active: No.  The current method of family planning is abstinence and post menopausal status.    Exercising: No.  The patient does not participate in regular exercise at present. Smoker:  no  Health Maintenance: Pap: 11/30/2011 normal with HR HPV MMG: 01/13/15, Bi-Rads 1:  Negative  Colonoscopy: 05/2011 WNL recheck in 10 years TDaP: 3 year per pt (PCP) Labs: PCP, Dr. Shelia Media   reports that she has never smoked. She has never used smokeless tobacco. She reports that she drinks alcohol. She reports that she does not use illicit drugs.  Past Medical History  Diagnosis Date  . Dysmenorrhea   . 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 Pelvic pain   .  Kidney stones 2001  . Genital HSV 1981  . Fibroid     Past Surgical History  Procedure Laterality Date  . Colonoscopy  05/2011  . 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 acyclovir (ZOVIRAX) 400 MG tablet Take 1 tablet (400 mg total) by mouth 2 (two) times daily. 180 tablet 3  . aspirin 81 MG tablet Take 81 mg by mouth daily.    Marland Kitchen buPROPion (WELLBUTRIN XL) 150 MG 24 hr tablet Take 2 tablets by mouth daily.  0  . Omega-3 Fatty Acids (FISH OIL) 1200 MG CAPS Take 1 capsule by mouth daily.    . sertraline (ZOLOFT) 50 MG tablet Take 0.5 tablets by mouth daily.  0  . tretinoin (RETIN-A) 0.05 % cream   0  . zolpidem (AMBIEN) 10 MG tablet Take 1 tablet by mouth at bedtime as needed.  0  . medroxyPROGESTERone (PROVERA) 10 MG tablet Take 1 tablet (10 mg total) by mouth daily. 10 tablet 0  . norethindrone (SHAROBEL) 0.35 MG tablet Take 1 tablet (0.35 mg total) by mouth daily. (Patient not taking: Reported on 04/14/2015) 3 Package 1   No current facility-administered medications for this  visit.    Family History  Problem Relation Age of Onset  . Diabetes Mother 87    Whipple Procedure from cyst on pancrease  . Heart failure Father   . Diabetes Maternal Grandmother   . Breast cancer Maternal Aunt 70    with metasis  . Stroke Paternal Grandmother   . Heart failure Paternal Grandfather     ROS:  Pertinent items are noted in HPI.  Otherwise, a comprehensive ROS was negative.  Exam:   BP 100/66 mmHg  Pulse 76  Ht 5' 3.5" (1.613 m)  Wt 180 lb (81.647 kg)  BMI 31.38 kg/m2  LMP 07/21/2014 Height: 5' 3.5" (161.3 cm) Ht Readings from Last 3 Encounters:  04/14/15 5' 3.5" (1.613 m)  06/11/14 5' 4.25" (1.632 m)  05/21/14 5' 4.25" (1.632 m)    General appearance: alert, cooperative and appears stated age Head: Normocephalic, without obvious abnormality, atraumatic Neck: no adenopathy, supple, symmetrical, trachea  midline and thyroid normal to inspection and palpation Lungs: clear to auscultation bilaterally Breasts: normal appearance, no masses or tenderness Heart: regular rate and rhythm Abdomen: soft, non-tender; no masses,  no organomegaly Extremities: extremities normal, atraumatic, no cyanosis or edema Skin: Skin color, texture, turgor normal. No rashes or lesions Lymph nodes: Cervical, supraclavicular, and axillary nodes normal. No abnormal inguinal nodes palpated Neurologic: Grossly normal   Pelvic: External genitalia:  no lesions              Urethra:  normal appearing urethra with no masses, tenderness or lesions              Bartholin's and Skene's: normal                 Vagina: normal appearing vagina with normal color and discharge, no lesions              Cervix: anteverted              Pap taken: Yes.   Bimanual Exam:  Uterus:  normal size, contour, position, consistency, mobility, non-tender              Adnexa: no mass, fullness, tenderness               Rectovaginal: Confirms               Anus:  normal sphincter tone, no lesions  Chaperone present: yes  A:  Well Woman with normal exam  Postmenopausal ? - current amenorrhea Right abdomen pain resolved - most likely due to right OV cyst and fibroids PUS 05/2014 History of renal calculi and uterine fibroid History of HSV  Situational stressors.   P:   Reviewed health and wellness pertinent to exam  Pap smear as above  Mammogram is due 5/17  Will do another Provera challenge and follow  Will recheck [facility] and follow  Will refill Zovirax 400 mg BID for a year  Counseled on breast self exam, mammography screening, adequate intake of calcium and vitamin D, diet and exercise return annually or prn  An After Visit Summary was printed and given to the patient.

## 2015-04-15 ENCOUNTER — Ambulatory Visit: Payer: BC Managed Care – PPO | Admitting: Nurse Practitioner

## 2015-04-15 LAB — FOLLICLE STIMULATING HORMONE: FSH: 54.7 m[IU]/mL

## 2015-04-15 NOTE — Progress Notes (Signed)
Encounter reviewed by Dr. Alleta Avery Amundson C. Silva.  

## 2015-04-16 LAB — IPS PAP TEST WITH HPV

## 2015-04-21 ENCOUNTER — Ambulatory Visit: Payer: No Typology Code available for payment source | Admitting: Nurse Practitioner

## 2015-05-25 ENCOUNTER — Telehealth: Payer: Self-pay | Admitting: Nurse Practitioner

## 2015-05-25 NOTE — Telephone Encounter (Signed)
Spoke with patient. Patient states that she took Provera 10 mg for 10 days waited one month after and has not had any bleeding. Advised patient I will let Kem Boroughs, FNP know and return call with further recommendations. Patient is agreeable.

## 2015-05-25 NOTE — Telephone Encounter (Signed)
Patient says her cycle did not come on after taking medication for 10 days.

## 2015-05-27 NOTE — Telephone Encounter (Signed)
Let patient know that she may or may not having more bleeding due Marissa showing perimenopausal. Let us know if she has bleeding or no bleeding in the next 3 months.

## 2015-05-28 NOTE — Telephone Encounter (Signed)
Spoke with patient. Message from Shelby Barnes CNM given. Patient verbalized understanding. She will call with follow up in 3 months with bleeding or no bleeding.  Routing to provider for final review. Patient agreeable to disposition. Will close encounter.

## 2015-05-28 NOTE — Telephone Encounter (Deleted)
Message left to return call to Chanya Chrisley at [phone].    

## 2015-05-28 NOTE — Telephone Encounter (Signed)
Message left to return call to Kirin Pastorino at [phone].    

## 2015-09-03 ENCOUNTER — Telehealth: Payer: Self-pay | Admitting: Nurse Practitioner

## 2015-09-03 NOTE — Telephone Encounter (Signed)
Patient was last seen 04/14/2015 for her aex. Was given Provera challenge. Patient called in October 2016 to repeat she had completed the Provera and not had a withdrawal bleed. She was advised to monitor her cycles for 3 months and return call with an update (please see telephone call dated 05/25/2015). [place] on 04/14/2015 was 54.7. Patient has not had a cycle. Routing to Kem Boroughs, FNP for review and advise.

## 2015-09-03 NOTE — Telephone Encounter (Signed)
Patient was told to call if she did not start her cycle.

## 2015-09-03 NOTE — Telephone Encounter (Signed)
Since no withdrawal after Provera challenge in October.  Do not need to repeat challenge.  [place] is elevated in the menopausal range.  At this time just monitor if any bleeding to call.  Also monitor vaso symptoms.

## 2015-09-06 NOTE — Telephone Encounter (Signed)
Left message to call Kaitlyn at [phone]. 

## 2015-09-08 NOTE — Telephone Encounter (Signed)
Spoke with patient. Advised of message as seen below from Kem Boroughs, [place]. Patient is agreeable and verbalizes understanding. Will return call with any future bleeding or increased vaso symptoms.  Routing to provider for final review. Patient agreeable to disposition. Will close encounter.

## 2015-09-14 ENCOUNTER — Telehealth: Payer: Self-pay

## 2015-09-14 DIAGNOSIS — N9489 Other specified conditions associated with female genital organs and menstrual cycle: Secondary | ICD-10-CM

## 2015-09-14 DIAGNOSIS — N950 Postmenopausal bleeding: Secondary | ICD-10-CM

## 2015-09-14 NOTE — Telephone Encounter (Signed)
I'd like her to come in for a PUS on Thursday if possible.

## 2015-09-14 NOTE — Telephone Encounter (Signed)
Spoke with patient at time of incoming call. Patient was last seen for aex on 04/14/2015. Was given Provera challenge. She called in October 2016 stating she had not started her cycle after completion of Provera. She was advised to monitor her cycle for 3 months and return call with an update. Patient called in on 09/03/2015 stating she had not had any bleeding. Last [facility] was performed on 04/14/2015 and was 54.7. Patient was advised on 09/03/2015 to monitor for any future bleeding and vaso symptoms. Patient is calling today to report increased cramping and light pink spotting when she went to the restroom. LMP was 07/2014. "The cramping is mainly on my right side. It is not like when I had a cyst, but it is uncomfortable." Advised I will speak with Kem Boroughs, FNP and return call with further recommendations. Patient is agreeable.

## 2015-09-14 NOTE — Telephone Encounter (Signed)
Spoke with patient. Advised of message as seen below from [place]. Patient is not able to be seen in the office on Thursday due to work schedule. Appointment for PUS scheduled for 09/23/2015 at 2:30 pm with 3 pm consult with Dr.Miller. Agreeable to date and time. Aware if symptoms increase or develops new symptoms she will need to be seen earlier for evaluation. Order placed for precert.  Cc: Lerry Liner  Routing to provider for final review. Patient agreeable to disposition. Will close encounter.

## 2015-09-15 ENCOUNTER — Telehealth: Payer: Self-pay | Admitting: Obstetrics & Gynecology

## 2015-09-15 NOTE — Telephone Encounter (Signed)
Patient called and provided new insurance information, will verify benefits recommended procedure with new carrier

## 2015-09-15 NOTE — Telephone Encounter (Signed)
Left message for patient to call. Will need her new insurance information to precert upcoming procedure

## 2015-09-23 ENCOUNTER — Other Ambulatory Visit: Payer: No Typology Code available for payment source | Admitting: Obstetrics & Gynecology

## 2015-09-23 ENCOUNTER — Other Ambulatory Visit: Payer: No Typology Code available for payment source

## 2015-09-23 ENCOUNTER — Ambulatory Visit (INDEPENDENT_AMBULATORY_CARE_PROVIDER_SITE_OTHER): Payer: 59

## 2015-09-23 ENCOUNTER — Ambulatory Visit (INDEPENDENT_AMBULATORY_CARE_PROVIDER_SITE_OTHER): Payer: 59 | Admitting: Obstetrics & Gynecology

## 2015-09-23 VITALS — BP 134/78 | HR 90 | Resp 16 | Ht 63.5 in | Wt 181.0 lb

## 2015-09-23 DIAGNOSIS — N83201 Unspecified ovarian cyst, right side: Secondary | ICD-10-CM

## 2015-09-23 DIAGNOSIS — N9489 Other specified conditions associated with female genital organs and menstrual cycle: Secondary | ICD-10-CM

## 2015-09-23 DIAGNOSIS — N950 Postmenopausal bleeding: Secondary | ICD-10-CM | POA: Diagnosis not present

## 2015-09-23 DIAGNOSIS — D259 Leiomyoma of uterus, unspecified: Secondary | ICD-10-CM

## 2015-09-23 DIAGNOSIS — R1031 Right lower quadrant pain: Secondary | ICD-10-CM | POA: Diagnosis not present

## 2015-09-23 MED ORDER — NORETHINDRONE 0.35 MG PO TABS: 1.0000 | ORAL_TABLET | Freq: Every day | ORAL | Status: DC

## 2015-09-23 NOTE — Progress Notes (Signed)
55 y.o. G0 Divorcedfemale here for a pelvic ultrasound due to RLQ pain and light pink spotting she experienced for a few days starting 09/14/15.  Pt has [facility] [place] [facility] 8/16 in 50 range.  Pt has done one Provera challenge without any bleeding in October.  Pt reports she felt "hormonal" before the bleeding occurred.  It was not heavy and was very light pink.    Patient's last menstrual period was 07/21/2014.  Sexually active:  no  Contraception: abstinence  FINDINGS: UTERUS: 6.5 x 5.7 x 4.8cm with 2.8 x 2.3 x 3.5cm, appears degenerative EMS: 5.43mm ADNEXA:   Left ovary 1.8 x 1.6 x 1.1cm   Right ovary 2.5 x 2.9 x 1.8cm with 1.8 x 1.8 x 1.7cm complex cyst that appears hemorrhagic (endometrioma less likely given and 47mm follicle CUL DE SAC: no free fluid  Findings reviewed with pt.  Pt would really like to not retake the Provera.  She has been on Micronor in the past and would prefer to restart this.  Will plan repeat PUS in 3 months and follow up at that time after starting the Micronor.  All questions answered.    Assessment:  1.8cm probable hemorrhagic ovarian cyst Perimenopausal bleeding Degenerating 2.8cm fibroid  Plan: Start micronor and follow up for repeat PUS in 3 months. AEX planned for late summer with me  ~15 minutes spent with patient >50% of time was in face to face discussion of above.

## 2015-09-29 ENCOUNTER — Encounter: Payer: Self-pay | Admitting: Obstetrics & Gynecology

## 2015-09-29 DIAGNOSIS — R1031 Right lower quadrant pain: Secondary | ICD-10-CM | POA: Insufficient documentation

## 2016-04-14 ENCOUNTER — Ambulatory Visit: Payer: No Typology Code available for payment source | Admitting: Nurse Practitioner

## 2016-04-14 ENCOUNTER — Ambulatory Visit: Payer: 59 | Admitting: Obstetrics & Gynecology

## 2016-05-02 ENCOUNTER — Ambulatory Visit (INDEPENDENT_AMBULATORY_CARE_PROVIDER_SITE_OTHER): Payer: 59 | Admitting: Nurse Practitioner

## 2016-05-02 ENCOUNTER — Encounter: Payer: Self-pay | Admitting: Nurse Practitioner

## 2016-05-02 ENCOUNTER — Telehealth: Payer: Self-pay | Admitting: Nurse Practitioner

## 2016-05-02 VITALS — BP 122/76 | HR 72 | Ht 63.25 in | Wt 184.0 lb

## 2016-05-02 DIAGNOSIS — N83201 Unspecified ovarian cyst, right side: Secondary | ICD-10-CM | POA: Diagnosis not present

## 2016-05-02 DIAGNOSIS — Z1211 Encounter for screening for malignant neoplasm of colon: Secondary | ICD-10-CM | POA: Diagnosis not present

## 2016-05-02 DIAGNOSIS — B009 Herpesviral infection, unspecified: Secondary | ICD-10-CM

## 2016-05-02 DIAGNOSIS — Z01419 Encounter for gynecological examination (general) (routine) without abnormal findings: Secondary | ICD-10-CM | POA: Diagnosis not present

## 2016-05-02 MED ORDER — ACYCLOVIR 400 MG PO TABS: 400.0000 mg | ORAL_TABLET | Freq: Two times a day (BID) | ORAL | 3 refills | Status: DC

## 2016-05-02 NOTE — Progress Notes (Signed)
Patient ID: Shelby Barnes, female   DOB: [birth year], 55 y.o.   MRN: [id number]  55 y.o. G0P0000 Divorced  Caucasian Fe here for annual exam.  No new health problems this year.    Patient's last menstrual period was 07/21/2014.          Sexually active: No.  The current method of family planning is post menopausal status.    Exercising: Yes.    walking Smoker:  no  Health Maintenance: Pap: 04/14/15, Negativewith neg HR HPV MMG: 01/13/15, Bi-Rads 1:  Negative  Colonoscopy:05/2011 WNL recheck in 10 years, IFOB is given TDaP:4 year per pt (PCP) Hep C and HIV: done today Labs: PCP takes care of all labs   reports that she has never smoked. She has never used smokeless tobacco. She reports that she drinks alcohol. She reports that she does not use drugs.  Past Medical History:  Diagnosis Date  . Dysmenorrhea   . Fibroid   . Genital HSV 1981  . Kidney stones 2001  . Pelvic pain   . STD (sexually transmitted disease) 1981   herpes    Past Surgical History:  Procedure Laterality Date  . 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 COLONOSCOPY  05/2011    Current Outpatient Prescriptions  Medication Sig Dispense Refill  . acyclovir (ZOVIRAX) 400 MG tablet Take 1 tablet (400 mg total) by mouth 2 (two) times daily. 180 tablet 3  . aspirin 81 MG tablet Take 81 mg by mouth daily.    Marland Kitchen buPROPion (WELLBUTRIN XL) 150 MG 24 hr tablet Take 2 tablets by mouth daily.  0  . norethindrone (SHAROBEL) 0.35 MG tablet Take 1 tablet (0.35 mg total) by mouth daily. 3 Package 2  . Omega-3 Fatty Acids (FISH OIL) 1200 MG CAPS Take 1 capsule by mouth daily.    . sertraline (ZOLOFT) 50 MG tablet Take 0.5 tablets by mouth daily.  0  . tretinoin (RETIN-A) 0.05 % cream   0  . zolpidem (AMBIEN) 10 MG tablet Take 1 tablet by mouth at bedtime as needed.  0   No current facility-administered medications for this visit.     Family History  Problem Relation Age of Onset  . Diabetes Mother 81   Whipple Procedure from cyst on pancrease  . Heart failure Father   . Diabetes Maternal Grandmother   . Breast cancer Maternal Aunt 70    with metasis  . Stroke Paternal Grandmother   . Heart failure Paternal Grandfather     ROS:  Pertinent items are noted in HPI.  Otherwise, a comprehensive ROS was negative.  Exam:   LMP 07/21/2014    Ht Readings from Last 3 Encounters:  09/23/15 5' 3.5" (1.613 m)  04/14/15 5' 3.5" (1.613 m)  06/11/14 5' 4.25" (1.632 m)    General appearance: alert, cooperative and appears stated age Head: Normocephalic, without obvious abnormality, atraumatic Neck: no adenopathy, supple, symmetrical, trachea midline and thyroid normal to inspection and palpation Lungs: clear to auscultation bilaterally Breasts: normal appearance, no masses or tenderness Heart: regular rate and rhythm Abdomen: soft, non-tender; no masses,  no organomegaly Extremities: extremities normal, atraumatic, no cyanosis or edema Skin: Skin color, texture, turgor normal. No rashes or lesions Lymph nodes: Cervical, supraclavicular, and axillary nodes normal. No abnormal inguinal nodes palpated Neurologic: Grossly normal   Pelvic: External genitalia:  no lesions              Urethra:  normal appearing urethra with no masses,  tenderness or lesions              Bartholin's and Skene's: normal                 Vagina: normal appearing vagina with normal color and discharge, no lesions              Cervix: anteverted              Pap taken: No. Bimanual Exam:  Uterus:  normal size, contour, position, consistency, mobility, non-tender              Adnexa: no mass, fullness, tenderness               Rectovaginal: Confirms               Anus:  normal sphincter tone, no lesions  Chaperone present: yes  A:  Well Woman with normal exam  Postmenopausal - current amenorrhea - will recheck [facility] Right abdomen pain resolved - most likely due to right OV cyst and fibroids PUS 05/2014/  09/2015 History of renal calculi  History of HSV              Situational stressors.   P:   Reviewed health and wellness pertinent to exam  Pap smear as above  Mammogram is due now and will schedule  Will also consult with Dr. Sabra Heck about need to follow up another PUS  IFOB is given and will follow with [facility], labs  Counseled on breast self exam, mammography screening, adequate intake of calcium and vitamin D, diet and exercise, Kegel's exercises return annually or prn  An After Visit Summary was printed and given to the patient.

## 2016-05-02 NOTE — Patient Instructions (Signed)

## 2016-05-02 NOTE — Telephone Encounter (Signed)
Patient is called back about a repeat PUS.  She feels no further pain.  Not sure if she needed another.  I see no apt for another PUS.  Will have Dr. Sabra Heck to review and decide.

## 2016-05-03 LAB — HIV ANTIBODY (ROUTINE TESTING W REFLEX): HIV: NONREACTIVE

## 2016-05-03 LAB — FOLLICLE STIMULATING HORMONE: FSH: 57.2 m[IU]/mL

## 2016-05-03 LAB — HEPATITIS C ANTIBODY: HCV Ab: NEGATIVE

## 2016-05-03 NOTE — Progress Notes (Signed)
Due to age and findings with prior PUS continue to recommend follow up ultrasound for this pt.  Please inform.  Reviewed personally.  Felipa Emory, MD.

## 2016-05-05 ENCOUNTER — Telehealth: Payer: Self-pay

## 2016-05-05 DIAGNOSIS — D259 Leiomyoma of uterus, unspecified: Secondary | ICD-10-CM

## 2016-05-05 DIAGNOSIS — N83201 Unspecified ovarian cyst, right side: Secondary | ICD-10-CM

## 2016-05-05 NOTE — Telephone Encounter (Signed)
Spoke with patient. Advised Shelby Boroughs, FNP has reviewed recommendation for repeat PUS with Dr.Miller. Per Dr.Miller repeat PUS is recommended for evaluation of ovarian finding and degenerative fibroid finding (please see telephone encounter dated 05/02/2016). Patient is agreeable. PUS appointment scheduled for 05/18/2016 at 8 am with 8:30 am consult with Dr.Miller. Patient is agreeable to date and time. Order placed for precert.  Cc: Shelby Boroughs, FNP  Shelby Barnes  Routing to provider for final review. Patient agreeable to disposition. Will close encounter.

## 2016-05-09 ENCOUNTER — Telehealth: Payer: Self-pay | Admitting: Obstetrics & Gynecology

## 2016-05-09 NOTE — Telephone Encounter (Signed)
Patient returned call. Reviewed  benefit for ultrasound. Patient understood and agreeable. Patient ready to schedule. Patient scheduled 05/18/16 with Dr Sabra Heck. Patient aware of arrival date, time and cancellation policy. No further questions. Ok to close

## 2016-05-09 NOTE — Telephone Encounter (Signed)
Called patient to review benefits for a recommended procedure. Left Voicemail requesting a call back. °

## 2016-05-18 ENCOUNTER — Other Ambulatory Visit: Payer: Self-pay | Admitting: Obstetrics & Gynecology

## 2016-05-18 ENCOUNTER — Encounter: Payer: Self-pay | Admitting: Obstetrics & Gynecology

## 2016-05-18 ENCOUNTER — Ambulatory Visit (INDEPENDENT_AMBULATORY_CARE_PROVIDER_SITE_OTHER): Payer: 59 | Admitting: Obstetrics & Gynecology

## 2016-05-18 ENCOUNTER — Ambulatory Visit (INDEPENDENT_AMBULATORY_CARE_PROVIDER_SITE_OTHER): Payer: 59

## 2016-05-18 VITALS — BP 100/72 | HR 68 | Resp 16 | Ht 64.0 in | Wt 183.4 lb

## 2016-05-18 DIAGNOSIS — D259 Leiomyoma of uterus, unspecified: Secondary | ICD-10-CM | POA: Diagnosis not present

## 2016-05-18 DIAGNOSIS — N83201 Unspecified ovarian cyst, right side: Secondary | ICD-10-CM

## 2016-05-18 NOTE — Progress Notes (Signed)
55 y.o. G0P0000 Divorced Caucasian female here for pelvic ultrasound due to recheck complex ovarian mass noted at last PUS.  Pt reports pain has completely resolved.  Feels well.  Denies any new gyn issues.  Had AEX with Kem Boroughs 05/02/16  Patient's last menstrual period was 07/21/2014.  Contraception: PMP  Findings:  UTERUS: 5.2 x 3.6 x 3.1cm with 2.0 x 2.3cm subserosal fibroid, decreased in size EMS:2.3mm ADNEXA: Left ovary: 2.4 x 0.9 x 0.7cm       Right ovary: 2.4 x 0.9 x 1.1cm CUL DE SAC:  No free fluid  Discussion:  Findings reviewed with pt.  Prior cyst was likely hemorrhagic cyst.  Fibroid smaller which is consistent with menopausal status.  Pt reassured.  Assessment:  Resolution of right complex ovarian cyst Subserosal fibroid, decreased in size  Plan:  Return for AEX or if any new issues arise.  ~15 minutes spent with patient >50% of time was in face to face discussion of above.  '

## 2016-07-27 ENCOUNTER — Encounter: Payer: Self-pay | Admitting: *Deleted

## 2017-05-09 ENCOUNTER — Encounter: Payer: Self-pay | Admitting: Certified Nurse Midwife

## 2017-05-09 ENCOUNTER — Ambulatory Visit (INDEPENDENT_AMBULATORY_CARE_PROVIDER_SITE_OTHER): Payer: 59 | Admitting: Certified Nurse Midwife

## 2017-05-09 ENCOUNTER — Ambulatory Visit: Payer: 59 | Admitting: Nurse Practitioner

## 2017-05-09 VITALS — BP 108/72 | HR 70 | Resp 16 | Ht 63.25 in | Wt 192.0 lb

## 2017-05-09 DIAGNOSIS — Z1231 Encounter for screening mammogram for malignant neoplasm of breast: Secondary | ICD-10-CM | POA: Diagnosis not present

## 2017-05-09 DIAGNOSIS — B009 Herpesviral infection, unspecified: Secondary | ICD-10-CM | POA: Diagnosis not present

## 2017-05-09 DIAGNOSIS — Z01419 Encounter for gynecological examination (general) (routine) without abnormal findings: Secondary | ICD-10-CM

## 2017-05-09 MED ORDER — ACYCLOVIR 400 MG PO TABS: 400.0000 mg | ORAL_TABLET | Freq: Once | ORAL | 3 refills | Status: AC

## 2017-05-09 NOTE — Patient Instructions (Signed)

## 2017-05-09 NOTE — Progress Notes (Signed)
Patient scheduled while in office for screening MMG at [place]. Spoke with [employer]. Patient scheduled for 05/11/17 at 7:20am, arriving at 7am. Patient is agreeable to date and time.

## 2017-05-09 NOTE — Progress Notes (Signed)
56 y.o. G0P0000 Divorced  Caucasian Fe here for annual exam.  Menopausal no vaginal bleeding or vaginal dryness. Social stress with loss of mother(last family) and both greyhounds have also died. Has agreed to be a foster parent for greyhound puppy, which thinks will help with emotions. Sees Dr. Shelia Media for aex/labs,medication management of anxiety/insomnia. Talked with PCP about  Wellbutrin XL not working as well and to get back with her regarding. Denies hot flashes or night sweats now. Continues Zovirax use for HSV, needs refill. No other health issues today.  Patient's last menstrual period was 07/21/2014.          Sexually active: No.  The current method of family planning is post menopausal status.  last [place] 57.2  Exercising: Yes.    walk Smoker:  no  Health Maintenance: Pap:  04-14-15 neg HPV HR neg History of Abnormal Pap: no MMG:  01-13-15 category b density birads 1:neg Self Breast exams: yes Colonoscopy:  2012 f/u 26yrs BMD: 25yrs ago   TDaP:  53yrs ago utd with pcp Shingles: no Pneumonia: no Hep C and HIV: HIV & Hep c neg 2017 Labs: pcp   reports that she has never smoked. She has never used smokeless tobacco. She reports that she drinks alcohol. She reports that she does not use drugs.  Past Medical History:  Diagnosis Date  . Dysmenorrhea   . Fibroid   . Genital HSV 1981  . Kidney stones 2001  . Pelvic pain   . STD (sexually transmitted disease) 1981   herpes    Past Surgical History:  Procedure Laterality Date  . 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 COLONOSCOPY  05/2011    Current Outpatient Prescriptions  Medication Sig Dispense Refill  . acyclovir (ZOVIRAX) 400 MG tablet Take 1 tablet (400 mg total) by mouth 2 (two) times daily. 180 tablet 3  . aspirin 81 MG tablet Take 81 mg by mouth daily.    Marland Kitchen buPROPion (WELLBUTRIN XL) 150 MG 24 hr tablet take 1 tablet by mouth every morning for 6 DAYS, THEN INCREASE to 2 tablets every morning  0  . Omega-3 Fatty  Acids (FISH OIL) 1200 MG CAPS Take 1 capsule by mouth daily.    Marland Kitchen tretinoin (RETIN-A) 0.05 % cream   0  . zolpidem (AMBIEN) 10 MG tablet Take 1 tablet by mouth at bedtime as needed.  0   No current facility-administered medications for this visit.     Family History  Problem Relation Age of Onset  . Diabetes Mother 78       Whipple Procedure from cyst on pancrease  . Heart failure Father   . Diabetes Maternal Grandmother   . Stroke Paternal Grandmother   . Heart failure Paternal Grandfather   . Breast cancer Maternal Aunt 70       with metasis    ROS:  Pertinent items are noted in HPI.  Otherwise, a comprehensive ROS was negative.  Exam:   BP 108/72   Pulse 70   Resp 16   Ht 5' 3.25" (1.607 m)   Wt 192 lb (87.1 kg)   LMP 07/21/2014   BMI 33.74 kg/m  Height: 5' 3.25" (160.7 cm) Ht Readings from Last 3 Encounters:  05/09/17 5' 3.25" (1.607 m)  05/18/16 5\' 4"  (1.626 m)  05/02/16 5' 3.25" (1.607 m)    General appearance: alert, cooperative and appears stated age Head: Normocephalic, without obvious abnormality, atraumatic Neck: no adenopathy, supple, symmetrical, trachea midline and  thyroid normal to inspection and palpation Lungs: clear to auscultation bilaterally Breasts: normal appearance, no masses or tenderness, No nipple retraction or dimpling, No nipple discharge or bleeding, No axillary or supraclavicular adenopathy Heart: regular rate and rhythm Abdomen: soft, non-tender; no masses,  no organomegaly Extremities: extremities normal, atraumatic, no cyanosis or edema Skin: Skin color, texture, turgor normal. No rashes or lesions Lymph nodes: Cervical, supraclavicular, and axillary nodes normal. No abnormal inguinal nodes palpated Neurologic: Grossly normal   Pelvic: External genitalia:  no lesions              Urethra:  normal appearing urethra with no masses, tenderness or lesions              Bartholin's and Skene's: normal                 Vagina: normal  appearing vagina with normal color and discharge, no lesions              Cervix: no cervical motion tenderness, no lesions and normal appearance              Pap taken: Bimanual Exam:  Uterus:  normal size, contour, position, consistency, mobility, non-tender              Adnexa: normal adnexa and no mass, fullness, tenderness               Rectovaginal: Confirms               Anus:  normal sphincter tone, no lesions  Chaperone present: yes  A:  Well Woman with normal exam  Menopausal no HRT  HSV history needs Zovirax refill  Mammogram due  [place], anxiety with PCP management    P:   Reviewed health and wellness pertinent to exam  Aware of need to advise if vaginal bleeding  Rx Zovirax see order with instructions  Will schedule prior to leaving today  Continue PCP follow up as indicated  Pap smear: no   counseled on breast self exam, mammography screening, feminine hygiene, menopause, adequate intake of calcium and vitamin D  return annually or prn  An After Visit Summary was printed and given to the patient.

## 2017-05-11 ENCOUNTER — Ambulatory Visit
Admission: RE | Admit: 2017-05-11 | Discharge: 2017-05-11 | Disposition: A | Discharge status: Home / Self Care | Payer: 59 | Source: Ambulatory Visit | Attending: Certified Nurse Midwife | Admitting: Certified Nurse Midwife

## 2017-05-11 DIAGNOSIS — Z1231 Encounter for screening mammogram for malignant neoplasm of breast: Secondary | ICD-10-CM

## 2018-05-14 ENCOUNTER — Ambulatory Visit: Payer: 59 | Admitting: Certified Nurse Midwife

## 2018-05-28 ENCOUNTER — Encounter: Payer: Self-pay | Admitting: Certified Nurse Midwife

## 2018-05-28 ENCOUNTER — Ambulatory Visit (INDEPENDENT_AMBULATORY_CARE_PROVIDER_SITE_OTHER): Payer: Managed Care, Other (non HMO) | Admitting: Certified Nurse Midwife

## 2018-05-28 ENCOUNTER — Other Ambulatory Visit (HOSPITAL_COMMUNITY)
Admission: RE | Admit: 2018-05-28 | Discharge: 2018-05-28 | Disposition: A | Discharge status: Home / Self Care | Payer: Managed Care, Other (non HMO) | Source: Ambulatory Visit | Attending: Certified Nurse Midwife | Admitting: Certified Nurse Midwife

## 2018-05-28 ENCOUNTER — Other Ambulatory Visit: Payer: Self-pay

## 2018-05-28 VITALS — BP 110/68 | Ht 63.25 in | Wt 185.0 lb

## 2018-05-28 DIAGNOSIS — Z124 Encounter for screening for malignant neoplasm of cervix: Secondary | ICD-10-CM | POA: Diagnosis present

## 2018-05-28 DIAGNOSIS — Z01419 Encounter for gynecological examination (general) (routine) without abnormal findings: Secondary | ICD-10-CM | POA: Diagnosis not present

## 2018-05-28 DIAGNOSIS — B009 Herpesviral infection, unspecified: Secondary | ICD-10-CM | POA: Diagnosis not present

## 2018-05-28 DIAGNOSIS — N951 Menopausal and female climacteric states: Secondary | ICD-10-CM

## 2018-05-28 MED ORDER — ACYCLOVIR 400 MG PO TABS: ORAL_TABLET | ORAL | 12 refills | Status: DC

## 2018-05-28 NOTE — Progress Notes (Signed)
57 y.o. G0P0000 Divorced  Caucasian Fe here for annual exam. Menopausal no HRT. Denies vaginal bleeding or dryness. Exercising  5 x weekly and walking, working on portion control. Has lost 7 pounds in the past few months and plans to continue to work on. Seeing PCP for aex in a few days with labs. 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 Needs Rx update. No health issues today. Planning cruise in spring!     Patient's last menstrual period was 07/21/2014.          Sexually active: No.  The current method of family planning is post menopausal status.    Exercising: Yes.    walk Smoker:  no  Review of Systems  Constitutional: Negative.   HENT: Negative.   Eyes: Negative.   Respiratory: Negative.   Cardiovascular: Negative.   Gastrointestinal: Negative.   Genitourinary: Negative.   Musculoskeletal: Negative.   Skin: Negative.   Neurological: Negative.   Endo/Heme/Allergies: Negative.   Psychiatric/Behavioral: Negative.     Health Maintenance: Pap:  04-14-15 neg HPV HR neg History of Abnormal Pap: no MMG:  05-11-17 category b density birads 1:neg Self Breast exams: yes Colonoscopy:  2012 f/u 52yrs BMD:   35yrs ago TDaP:  41yrs ago with pcp Shingles: 2018 Pneumonia: no Hep C and HIV: both neg 2017 Labs: PCP   reports that she has never smoked. She has never used smokeless tobacco. She reports that she drinks alcohol. She reports that she does not use drugs.  Past Medical History:  Diagnosis Date  . Dysmenorrhea   . Fibroid   . Genital HSV 1981  . Kidney stones 2001  . Pelvic pain   . STD (sexually transmitted disease) 1981   herpes    Past Surgical History:  Procedure Laterality Date  . 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 COLONOSCOPY  05/2011    Current Outpatient Medications  Medication Sig Dispense Refill  . aspirin 81 MG tablet Take 81 mg by mouth daily.    Marland Kitchen buPROPion (WELLBUTRIN XL) 150 MG 24 hr tablet take 1 tablet by mouth every morning for  6 DAYS, THEN INCREASE to 2 tablets every morning  0  . Omega-3 Fatty Acids (FISH OIL) 1200 MG CAPS Take 1 capsule by mouth daily.    Marland Kitchen tretinoin (RETIN-A) 0.05 % cream   0  . zolpidem (AMBIEN) 10 MG tablet Take 1 tablet by mouth at bedtime as needed.  0   No current facility-administered medications for this visit.     Family History  Problem Relation Age of Onset  . Diabetes Mother 43       Whipple Procedure from cyst on pancrease  . Heart failure Father   . Diabetes Maternal Grandmother   . Stroke Paternal Grandmother   . Heart failure Paternal Grandfather   . Breast cancer Maternal Aunt 70       with metasis    ROS:  Pertinent items are noted in HPI.  Otherwise, a comprehensive ROS was negative.  Exam:   LMP 07/21/2014    Ht Readings from Last 3 Encounters:  05/09/17 5' 3.25" (1.607 m)  05/18/16 5\' 4"  (1.626 m)  05/02/16 5' 3.25" (1.607 m)    General appearance: alert, cooperative and appears stated age Head: Normocephalic, without obvious abnormality, atraumatic Neck: no adenopathy, supple, symmetrical, trachea midline and thyroid normal to inspection and palpation Lungs: clear to auscultation bilaterally Breasts: normal appearance, no masses or tenderness, No nipple retraction or  dimpling, No nipple discharge or bleeding, No axillary or supraclavicular adenopathy Heart: regular rate and rhythm Abdomen: soft, non-tender; no masses,  no organomegaly Extremities: extremities normal, atraumatic, no cyanosis or edema Skin: Skin color, texture, turgor normal. No rashes or lesions Lymph nodes: Cervical, supraclavicular, and axillary nodes normal. No abnormal inguinal nodes palpated Neurologic: Grossly normal   Pelvic: External genitalia:  no lesions              Urethra:  normal appearing urethra with no masses, tenderness or lesions              Bartholin's and Skene's: normal                 Vagina: normal appearing vagina with normal color and discharge, no lesions               Cervix: no cervical motion tenderness, no lesions and normal appearance              Pap taken: Yes.   Bimanual Exam:  Uterus:  anteverted              Adnexa: normal adnexa and no mass, fullness, tenderness               Rectovaginal: Confirms               Anus:  normal sphincter tone, no lesions  Chaperone present: yes  A:  Well Woman with normal exam  Menopausal no HRT  Weight loss in progress  HSV2 history with suppression use  Labs and aex with PCP yearly  Mammogram due    P:   Reviewed health and wellness pertinent to exam  Aware of need to advise if vaginal bleeding  Encouraged to continue weight loss journey for better health profile  Rx Acyclovir see order with instructions  Continue follow up with MD  Discussed yearly mammogram or every two years. Questions addressed. Will plan yearly and schedule.  Pap smear: yes   counseled on breast self exam, mammography screening, feminine hygiene, menopause, adequate intake of calcium and vitamin D, diet and exercise  return annually or prn  An After Visit Summary was printed and given to the patient.

## 2018-05-28 NOTE — Patient Instructions (Addendum)

## 2018-05-30 LAB — CYTOLOGY - PAP
DIAGNOSIS: NEGATIVE
HPV (WINDOPATH): NOT DETECTED

## 2018-08-02 ENCOUNTER — Other Ambulatory Visit: Payer: Self-pay | Admitting: Certified Nurse Midwife

## 2018-08-02 DIAGNOSIS — B009 Herpesviral infection, unspecified: Secondary | ICD-10-CM

## 2019-05-30 ENCOUNTER — Other Ambulatory Visit: Payer: Self-pay

## 2019-06-02 NOTE — Progress Notes (Signed)
58 y.o. G0P0000 Divorced  Caucasian Fe here for annual exam. Menopausal no symptoms now.Denies vaginal bleeding or vaginal dryness. Not sexually active at all. Sees Dr. Shelia Media for aex/labs, medication management of Lexapro an Ambien/Prilosec has scheduled. 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 Needs Rx updated. Working daily, no health concerns today.  Patient's last menstrual period was 07/21/2014.          Sexually active: No.  The current method of family planning is post menopausal status.    Exercising: Yes.    walking Smoker:  no  Review of Systems  Constitutional: Negative.   HENT: Negative.   Eyes: Negative.   Respiratory: Negative.   Cardiovascular: Negative.   Gastrointestinal: Negative.   Genitourinary: Negative.   Musculoskeletal: Negative.   Skin: Negative.   Neurological: Negative.   Endo/Heme/Allergies: Negative.   Psychiatric/Behavioral: Negative.     Health Maintenance: Pap:  04-14-15 neg HPV HR neg, 05-28-18 neg HPV HR neg History of Abnormal Pap: no MMG:  05-11-17 category b density birads 1:neg Self Breast exams: yes Colonoscopy:  2012 f/u 35yrs BMD:   35yrs ago TDaP:  24yrs ago with pcp Shingles: 2018 Pneumonia: not done Hep C and HIV: both neg 2017 Labs: if needed   reports that she has never smoked. She has never used smokeless tobacco. She reports current alcohol use of about 2.0 standard drinks of alcohol per week. She reports that she does not use drugs.  Past Medical History:  Diagnosis Date  . Dysmenorrhea   . Fibroid   . Genital HSV 1981  . Kidney stones 2001  . Pelvic pain   . STD (sexually transmitted disease) 1981   herpes    Past Surgical History:  Procedure Laterality Date  . 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 COLONOSCOPY  05/2011    Current Outpatient Medications  Medication Sig Dispense Refill  . acyclovir (ZOVIRAX) 400 MG tablet Two tablet daily for HSV suppression 60 tablet 12   No current facility-administered medications for this  visit.     Family History  Problem Relation Age of Onset  . Diabetes Mother 72       Whipple Procedure from cyst on pancrease  . Heart failure Father   . Diabetes Maternal Grandmother   . Stroke Paternal Grandmother   . Heart failure Paternal Grandfather   . Breast cancer Maternal Aunt 70       with metasis    ROS:  Pertinent items are noted in HPI.  Otherwise, a comprehensive ROS was negative.  Exam:   LMP 07/21/2014    Ht Readings from Last 3 Encounters:  05/28/18 5' 3.25" (1.607 m)  05/09/17 5' 3.25" (1.607 m)  05/18/16 5\' 4"  (1.626 m)    General appearance: alert, cooperative and appears stated age Head: Normocephalic, without obvious abnormality, atraumatic Neck: no adenopathy, supple, symmetrical, trachea midline and thyroid normal to inspection and palpation Lungs: clear to auscultation bilaterally Breasts: normal appearance, no masses or tenderness, No nipple retraction or dimpling, No nipple discharge or bleeding, No axillary or supraclavicular adenopathy Heart: regular rate and rhythm Abdomen: soft, non-tender; no masses,  no organomegaly Extremities: extremities normal, atraumatic, no cyanosis or edema Skin: Skin color, texture, turgor normal. No rashes or lesions Lymph nodes: Cervical, supraclavicular, and axillary nodes normal. No abnormal inguinal nodes palpated Neurologic: Grossly normal   Pelvic: External genitalia:  no lesions              Urethra:  normal appearing urethra with no  masses, tenderness or lesions              Bartholin's and Skene's: normal                 Vagina: normal appearing vagina with normal color and discharge, no lesions              Cervix: no cervical motion tenderness, no lesions and normal appearance              Pap taken: No. Bimanual Exam:  Uterus:  enlarged, 8-10 week size, known fibroid, no change in size, tilts to left weeks size              Adnexa: normal adnexa and no mass, fullness, tenderness                Rectovaginal: Confirms               Anus:  normal sphincter tone, no lesions  Chaperone present: yes  A:  Well Woman with normal exam  Menopausal no HRT  Enlarged uterus known fibroid, no significant change, not symptomatic  History of HSV 2 needs refill update  Anxiety/depression/ insomnia with PCP management  Mammogram due  P:   Reviewed health and wellness pertinent to exam  Aware of need to advise if vaginal bleeding or vaginal dryness issues  Discussed no change in uterine size.  Continue follow up with PCP as indicated.  Stressed SBE and mammogram yearly.  Pap smear: no   counseled on breast self exam, mammography screening, feminine hygiene, menopause, adequate intake of calcium and vitamin D, diet and exercise  return annually or prn  An After Visit Summary was printed and given to the patient.

## 2019-06-03 ENCOUNTER — Other Ambulatory Visit: Payer: Self-pay

## 2019-06-03 ENCOUNTER — Ambulatory Visit (INDEPENDENT_AMBULATORY_CARE_PROVIDER_SITE_OTHER): Payer: Managed Care, Other (non HMO) | Admitting: Certified Nurse Midwife

## 2019-06-03 ENCOUNTER — Encounter: Payer: Self-pay | Admitting: Certified Nurse Midwife

## 2019-06-03 VITALS — BP 120/80 | HR 70 | Temp 97.0°F | Resp 16 | Ht 63.25 in | Wt 180.0 lb

## 2019-06-03 DIAGNOSIS — Z86018 Personal history of other benign neoplasm: Secondary | ICD-10-CM

## 2019-06-03 DIAGNOSIS — Z01419 Encounter for gynecological examination (general) (routine) without abnormal findings: Secondary | ICD-10-CM

## 2019-06-03 DIAGNOSIS — N951 Menopausal and female climacteric states: Secondary | ICD-10-CM

## 2019-06-03 DIAGNOSIS — Z8619 Personal history of other infectious and parasitic diseases: Secondary | ICD-10-CM | POA: Diagnosis not present

## 2019-06-03 MED ORDER — ACYCLOVIR 400 MG PO TABS: ORAL_TABLET | ORAL | 12 refills | Status: DC

## 2019-06-03 NOTE — Patient Instructions (Signed)
EXERCISE AND DIET:  We recommended that you start or continue a regular exercise program for good health. Regular exercise means any activity that makes your heart beat faster and makes you sweat.  We recommend exercising at least 30 minutes per day at least 3 days a week, preferably 4 or 5.  We also recommend a diet low in fat and sugar.  Inactivity, poor dietary choices and obesity can cause diabetes, heart attack, stroke, and kidney damage, among others.   ° °ALCOHOL AND SMOKING:  Women should limit their alcohol intake to no more than 7 drinks/beers/glasses of wine (combined, not each!) per week. Moderation of alcohol intake to this level decreases your risk of breast cancer and liver damage. And of course, no recreational drugs are part of a healthy lifestyle.  And absolutely no smoking or even second hand smoke. Most people know smoking can cause heart and lung diseases, but did you know it also contributes to weakening of your bones? Aging of your skin?  Yellowing of your teeth and nails? ° °CALCIUM AND VITAMIN D:  Adequate intake of calcium and Vitamin D are recommended.  The recommendations for exact amounts of these supplements seem to change often, but generally speaking 600 mg of calcium (either carbonate or citrate) and 800 units of Vitamin D per day seems prudent. Certain women may benefit from higher intake of Vitamin D.  If you are among these women, your doctor will have told you during your visit.   ° °PAP SMEARS:  Pap smears, to check for cervical cancer or precancers,  have traditionally been done yearly, although recent scientific advances have shown that most women can have pap smears less often.  However, every woman still should have a physical exam from her gynecologist every year. It will include a breast check, inspection of the vulva and vagina to check for abnormal growths or skin changes, a visual exam of the cervix, and then an exam to evaluate the size and shape of the uterus and  ovaries.  And after 59 years of age, a rectal exam is indicated to check for rectal cancers. We will also provide age appropriate advice regarding health maintenance, like when you should have certain vaccines, screening for sexually transmitted diseases, bone density testing, colonoscopy, mammograms, etc.  ° °MAMMOGRAMS:  All women over 40 years old should have a yearly mammogram. Many facilities now offer a "3D" mammogram, which may cost around $50 extra out of pocket. If possible,  we recommend you accept the option to have the 3D mammogram performed.  It both reduces the number of women who will be called back for extra views which then turn out to be normal, and it is better than the routine mammogram at detecting truly abnormal areas.   ° °COLONOSCOPY:  Colonoscopy to screen for colon cancer is recommended for all women at age 50.  We know, you hate the idea of the prep.  We agree, BUT, having colon cancer and not knowing it is worse!!  Colon cancer so often starts as a polyp that can be seen and removed at colonscopy, which can quite literally save your life!  And if your first colonoscopy is normal and you have no family history of colon cancer, most women don't have to have it again for 10 years.  Once every ten years, you can do something that may end up saving your life, right?  We will be happy to help you get it scheduled when you are ready.    Be sure to check your insurance coverage so you understand how much it will cost.  It may be covered as a preventative service at no cost, but you should check your particular policy.      Preventing Influenza, Adult Influenza, more commonly known as the flu, is a viral infection that mainly affects the respiratory tract. The respiratory tract includes structures that help you breathe, such as the lungs, nose, and throat. The flu causes many common cold symptoms, as well as a high fever and body aches. The flu spreads easily from person to person (is  contagious). The flu is most common from December through March. This is called flu season.You can catch the flu virus by:  Breathing in droplets from an infected person's cough or sneeze.  Touching something that was recently contaminated with the virus and then touching your mouth, nose, or eyes. What can I do to lower my risk?        You can decrease your risk of getting the flu by:  Getting a flu shot (influenza vaccination) every year. This is the best way to prevent the flu. A flu shot is recommended for everyone age 37 months and older. ? It is best to get a flu shot in the fall, as soon as it is available. Getting a flu shot during winter or spring instead is still a good idea. Flu season can last into early spring. ? Preventing the flu through vaccination requires getting a new flu shot every year. This is because the flu virus changes slightly (mutates) from one year to the next. Even if a flu shot does not completely protect you from all flu virus mutations, it can reduce the severity of your illness and prevent dangerous complications of the flu. ? If you are pregnant, you can and should get a flu shot. ? If you have had a reaction to the shot in the past or if you are allergic to eggs, check with your health care provider before getting a flu shot. ? Sometimes the vaccine is available as a nasal spray. In some years, the nasal spray has not been as effective against the flu virus. Check with your health care provider if you have questions about this.  Practicing good health habits. This is especially important during flu season. ? Avoid contact with people who are sick with flu or cold symptoms. ? Wash your hands with soap and water often. If soap and water are not available, use alcohol-based hand sanitizer. ? Avoid touching your hands to your face, especially when you have not washed your hands recently. ? Use a disinfectant to clean surfaces at home and at work that may be  contaminated with the flu virus. ? Keep your bodys disease-fighting system (immune system) in good shape by eating a healthy diet, drinking plenty of fluids, getting enough sleep, and exercising regularly. If you do get the flu, avoid spreading it to others by:  Staying home until your symptoms have been gone for at least one day.  Covering your mouth and nose when you cough or sneeze.  Avoiding close contact with others, especially babies and elderly people. Why are these changes important? Getting a flu shot and practicing good health habits protects you as well as other people. If you get the flu, your friends, family, and co-workers are also at risk of getting it, because it spreads so easily to others. Each year, about 2 out of every 10 people get the flu. Having  the flu can lead to complications, such as pneumonia, ear infection, and sinus infection. The flu also can be deadly, especially for babies, people older than age 21, and people who have serious long-term diseases. How is this treated? Most people recover from the flu by resting at home and drinking plenty of fluids. However, a prescription antiviral medicine may reduce your flu symptoms and may make your flu go away sooner. This medicine must be started within a few days of getting flu symptoms. You can talk with your health care provider about whether you need an antiviral medicine. Antiviral medicine may be prescribed for people who are at risk for more serious flu symptoms. This includes people who:  Are older than age 68.  Are pregnant.  Have a condition that makes the flu worse or more dangerous. Where to find more information  Centers for Disease Control and Prevention: http://www.smith-bell.org/  [employer].[employer].ee: azureicus.com  American Academy of Family Physicians: familydoctor.org/familydoctor/en/kids/vaccines/preventing-the-flu.html Contact a health care provider if:  You have influenza and you  develop new symptoms.  You have: ? Chest pain. ? Diarrhea. ? A fever.  Your cough gets worse, or you produce more mucus. Summary  The best way to prevent the flu is to get a flu shot every year in the fall.  Even if you get the flu after you have received the yearly vaccine, your flu may be milder and go away sooner because of your flu shot.  If you get the flu, antiviral medicines that are started with a few days of symptoms may reduce your flu symptoms and may make your flu go away sooner.  You can also help prevent the flu by practicing good health habits. This information is not intended to replace advice given to you by your health care provider. Make sure you discuss any questions you have with your health care provider. Document Released: 08/22/2015 Document Revised: 07/20/2017 Document Reviewed: 04/15/2016 Elsevier Patient Education  2020 [employer].

## 2019-06-27 ENCOUNTER — Telehealth: Payer: Self-pay

## 2019-06-27 NOTE — Telephone Encounter (Signed)
Per patient she only needs her weight, height, and blood pressure added to her wellness form & mailed to her. Patient told we will get that put on the form & mailed to her.

## 2019-07-31 ENCOUNTER — Telehealth: Payer: Self-pay | Admitting: Certified Nurse Midwife

## 2019-07-31 NOTE — Telephone Encounter (Signed)
Spoke with patient, advised per Deborah Leonard, CNM. Patient verbalizes understanding and is agreeable.   Encounter closed.  

## 2019-07-31 NOTE — Telephone Encounter (Signed)
Per last note she was not having menopausal symptoms which is the usual. I don't feel HRT would benefit her per your note of concerns. Recommend continue with Psychiatrist

## 2019-07-31 NOTE — Telephone Encounter (Signed)
Patient is experiencing depression and anxiety. She is taking the highest does of ambien and still not sleeping.

## 2019-07-31 NOTE — Telephone Encounter (Signed)
Spoke with patient. Patient reports Hx of anxiety and depression, currently managed by PCP with Lexapro and Ambien. Ambien has been increased to 12.5mg  nightly for the past 3 nights, no help with insomnia. Insomnia has been increasing over the past 3 months. Has tried different medications and has had a sleep study. PCP has referred her to a psychiatrist for further management of medications, has a virtual visit on 09/12/19.   Patient is asking if HRT would be an option or beneficial for her at this time? Denies any other vasomotor symptoms. Would like to know prior to scheduling consult. Advised I will review with Melvia Heaps, CNM and return call. Patient agreeable.   Melvia Heaps, CNM - please advise.

## 2019-09-30 ENCOUNTER — Ambulatory Visit: Payer: Managed Care, Other (non HMO) | Attending: Internal Medicine

## 2019-09-30 DIAGNOSIS — Z20822 Contact with and (suspected) exposure to covid-19: Secondary | ICD-10-CM

## 2019-10-01 LAB — NOVEL CORONAVIRUS, NAA: SARS-CoV-2, NAA: NOT DETECTED

## 2019-11-10 ENCOUNTER — Encounter: Payer: Self-pay | Admitting: Certified Nurse Midwife

## 2020-03-11 ENCOUNTER — Telehealth: Payer: Self-pay | Admitting: Obstetrics and Gynecology

## 2020-03-11 NOTE — Telephone Encounter (Signed)
Spoke with patient. Patient states she had her hormone levels check by [facility], reports "DHEA was high at 769". Patient states she has always had trouble sleeping, this is why she went to them. She reports she is under increased stress. Patient reports she had a testosterone pellet placed today and was started on progesterone. Patient asking if the DHEA is any concern?   Last AEX w/ Melvia Heaps, CNM on 06/03/19  Recommended OV with Dr. Quincy Simmonds for further discussion and evaluation, patient declined. Patient states she does not want to come in of not needed. Patient request provider review and advise on f/u.   Dr. Quincy Simmonds -please advise.

## 2020-03-11 NOTE — Telephone Encounter (Signed)
Patient had her "hormones levels checked at [facility] and some of her levels were high".

## 2020-03-12 NOTE — Telephone Encounter (Signed)
Patient's DHEA level is high.  She needs to ask if they are currently giving her DHEA supplementation, which can raise the level.  If she is not taking this kind of supplementation, I recommend she see her PCP for further evaluation.

## 2020-03-12 NOTE — Telephone Encounter (Signed)
Spoke with patient. Advised per Dr. Quincy Simmonds. Patient states she is not on any DHEA supplements, she scheduled to have labs repeated in 4 wks. Patient states she has contacted her PCP and advised to f/u with GYN. Patient states she will wait for recheck, if still elevated will f/u with PCP. Advised I will update Dr. Quincy Simmonds, our office will return call if any additional recommendations. Patient agreeable.   Routing to provider for final review. Patient is agreeable to disposition. Will close encounter.

## 2020-06-04 ENCOUNTER — Ambulatory Visit: Payer: Managed Care, Other (non HMO) | Admitting: Certified Nurse Midwife

## 2020-06-18 NOTE — Progress Notes (Signed)
59 y.o. G0P0000 Divorced White or Caucasian female here for annual exam.   Pt also is a patient of [facility]. She takes Testosterone pellets and Oral progesterone daily. This regimen has cured her 15 year insomnia problem. Today she does not report any problems. Wants urine tested today because she sometimes feels a "twinge" when she urinates. She feels like when she drinks more water, everything feels normal.  Patient's last menstrual period was 07/21/2014.          Sexually active: No.  The current method of family planning is post menopausal status.    Exercising: Yes.    walking 5 times a week. (2 miles) Smoker:  no  Health Maintenance: Pap:  04-14-15 neg HPV HR neg, 05-28-18 neg HPV HR neg History of abnormal Pap:  no MMG:  05-11-17 category b density birads 1:neg Colonoscopy:  2012 f/u 76yrs BMD:   28yrs ago TDaP:  8 yrs ago with pcp Pneumonia vaccine(s):  Not done Shingrix:   2018 Hep C testing: neg 2017 Gardasil: n/a Covid vaccination: not done,  Pt firm about not wanting covid-19 vaccine and does not want to talk about it today Screening Labs: Has appointment with PCP in Dec 2021, will get labs drawn then poct urine-neg   reports that she has never smoked. She has never used smokeless tobacco. She reports current alcohol use. She reports that she does not use drugs.  Works in Engineer, production, full time.  Reports last outbreak of HSV was several years ago. Would like to have suppressive therapy.  Past Medical History:  Diagnosis Date  . Anxiety   . Depression   . Dysmenorrhea   . Fibroid   . Genital HSV 1981  . Kidney stones 2001  . Pelvic pain   . STD (sexually transmitted disease) 1981   herpes    Past Surgical History:  Procedure Laterality Date  . 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 COLONOSCOPY  05/2011    Current Outpatient Medications  Medication Sig Dispense Refill  . acyclovir (ZOVIRAX) 400 MG tablet Two tablet  daily for HSV suppression 60 tablet 12  . ALPRAZolam (XANAX) 1 MG tablet Take 1 mg by mouth at bedtime as needed.    Marland Kitchen omeprazole (PRILOSEC) 40 MG capsule as needed.    . progesterone (PROMETRIUM) 100 MG capsule TAKE 1 TO 2 CAPSULES BY MOUTH EVERY NIGHT AT BEDTIME    . UNABLE TO FIND Bio-identical testosterone pellet    . zolpidem (AMBIEN CR) 12.5 MG CR tablet Take 12.5 mg by mouth at bedtime as needed.     No current facility-administered medications for this visit.    Family History  Problem Relation Age of Onset  . Diabetes Mother 57       Whipple Procedure from cyst on pancrease  . Heart failure Father   . Diabetes Maternal Grandmother   . Stroke Paternal Grandmother   . Heart failure Paternal Grandfather   . Breast cancer Maternal Aunt 70       with metasis    Review of Systems  Constitutional: Negative.   HENT: Negative.   Eyes: Negative.   Respiratory: Negative.   Cardiovascular: Negative.   Gastrointestinal: Negative.   Endocrine: Negative.   Genitourinary: Negative.   Musculoskeletal: Negative.   Skin: Negative.   Allergic/Immunologic: Negative.   Neurological: Negative.   Hematological: Negative.   Psychiatric/Behavioral: Negative.     Exam:   BP 118/74   Pulse 70  Resp 16   Ht 5' 3.5" (1.613 m)   Wt 188 lb (85.3 kg)   LMP 07/21/2014   BMI 32.78 kg/m   Height: 5' 3.5" (161.3 cm)  General appearance: alert, cooperative and appears stated age Head: Normocephalic, without obvious abnormality, atraumatic Neck: no adenopathy, supple, symmetrical, trachea midline and thyroid normal to inspection and palpation Lungs: clear to auscultation bilaterally Breasts: normal appearance, no masses or tenderness, No nipple retraction or dimpling, No nipple discharge or bleeding, No axillary or supraclavicular adenopathy, Normal to palpation without dominant masses Heart: regular rate and rhythm Abdomen: soft, non-tender; bowel sounds normal; no masses,  no  organomegaly Extremities: extremities normal, atraumatic, no cyanosis or edema Skin: Skin color, texture, turgor normal. No rashes or lesions Lymph nodes: Cervical, supraclavicular, and axillary nodes normal. No abnormal inguinal nodes palpated Neurologic: Grossly normal   Pelvic: External genitalia:  no lesions              Urethra:  normal appearing urethra with no masses, tenderness or lesions              Bartholins and Skenes: normal                 Vagina: normal appearing vagina with normal color and discharge, no lesions              Cervix: no cervical motion tenderness and no lesions              Pap taken: No. Bimanual Exam:  Uterus:  Mildly enlarged ~8-10 weeks size, non tender, mid to anteverted position              Adnexa: no mass, fullness, tenderness               Rectovaginal: Confirms, no mass palpable               Anus:  normal sphincter tone, no lesions  Chaperone, Raquel Sarna, RN, was present for exam.  A:  Well Woman with normal exam  Hx HSV, desires suppressive therapy  Mildly enlarged uterus known fibroid, no significant change, not symptomatic           Mammogram due   P:   Mammogram (last done 2018), pt states will call to schedule  pap smear due 2024 -cotesting  Refill Acyclovir 400mg  bid, #180, 4 rf  Recommended to start 1200mg  Calcium daily  Continue care with PCP for screening labs  return annually or prn

## 2020-06-22 ENCOUNTER — Other Ambulatory Visit: Payer: Self-pay | Admitting: Obstetrics & Gynecology

## 2020-06-22 ENCOUNTER — Ambulatory Visit (INDEPENDENT_AMBULATORY_CARE_PROVIDER_SITE_OTHER): Payer: Managed Care, Other (non HMO) | Admitting: Nurse Practitioner

## 2020-06-22 ENCOUNTER — Other Ambulatory Visit: Payer: Self-pay

## 2020-06-22 ENCOUNTER — Encounter: Payer: Self-pay | Admitting: Nurse Practitioner

## 2020-06-22 VITALS — BP 118/74 | HR 70 | Resp 16 | Ht 63.5 in | Wt 188.0 lb

## 2020-06-22 DIAGNOSIS — Z0000 Encounter for general adult medical examination without abnormal findings: Secondary | ICD-10-CM | POA: Diagnosis not present

## 2020-06-22 DIAGNOSIS — Z8619 Personal history of other infectious and parasitic diseases: Secondary | ICD-10-CM

## 2020-06-22 DIAGNOSIS — Z01419 Encounter for gynecological examination (general) (routine) without abnormal findings: Secondary | ICD-10-CM | POA: Diagnosis not present

## 2020-06-22 DIAGNOSIS — Z1231 Encounter for screening mammogram for malignant neoplasm of breast: Secondary | ICD-10-CM

## 2020-06-22 LAB — POCT URINALYSIS DIPSTICK
Bilirubin, UA: NEGATIVE
Blood, UA: NEGATIVE
Glucose, UA: NEGATIVE
Ketones, UA: NEGATIVE
Leukocytes, UA: NEGATIVE
Nitrite, UA: NEGATIVE
Protein, UA: NEGATIVE
Urobilinogen, UA: NEGATIVE E.U./dL — AB
pH, UA: 5 (ref 5.0–8.0)

## 2020-06-22 MED ORDER — ACYCLOVIR 400 MG PO TABS: ORAL_TABLET | ORAL | 4 refills | Status: DC

## 2020-06-22 NOTE — Patient Instructions (Signed)
Calcium Acetate capsules or tablets What is this medicine? CALCIUM ACETATE (KAL see um ASa tate) is a calcium salt. It works to bind phosphate in the digestive tract. This medicine is used to treat high levels of phosphate in patients with chronic renal failure. This medicine may be used for other purposes; ask your health care provider or pharmacist if you have questions. COMMON BRAND NAME(S): Calphron, Eliphos, PhosLo What should I tell my health care provider before I take this medicine? They need to know if you have any of these conditions:  high level of calcium in the blood  history of kidney stones  an unusual or allergic reaction to calcium, other medicines, foods, dyes, or preservatives  pregnant or trying to get pregnant  breast-feeding How should I use this medicine? Take this medicine by mouth with a glass of water. Follow the directions on the prescription label. Take with meals. Take your medicine at regular intervals. Do not take your medicine more often than directed. Do not stop taking except on your doctor's advice. Talk to your pediatrician regarding the use of this medicine in children. Special care may be needed. Overdosage: If you think you have taken too much of this medicine contact a poison control center or emergency room at once. NOTE: This medicine is only for you. Do not share this medicine with others. What if I miss a dose? If you miss a dose, take it as soon as you can. If it is almost time for your next dose, take only that dose. Do not take double or extra doses. What may interact with this medicine? Do not take this medicine with any of the following medications:  any calcium containing product including certain antacids and vitamin supplements This medicine may also interact with the following medications:  certain antibiotics called quinolones like ciprofloxacin, levofloxacin, moxifloxacin  certain antibiotics called tetracyclines like doxycycline,  minocycline, tetracycline  digoxin  phenytoin  sucralfate  thyroid hormones like levothyroxine This list may not describe all possible interactions. Give your health care provider a list of all the medicines, herbs, non-prescription drugs, or dietary supplements you use. Also tell them if you smoke, drink alcohol, or use illegal drugs. Some items may interact with your medicine. What should I watch for while using this medicine? Visit your doctor or health care professional for regular check ups. You may need blood work done while you are taking this medicine. You may need to be on a special diet while taking this medicine. Check with your doctor. Do not take this medicine within 2 hours of any other medication taken by mouth. What side effects may I notice from receiving this medicine? Side effects that you should report to your doctor or health care professional as soon as possible:  allergic reactions like skin rash, itching or hives, swelling of the face, lips, or tongue  blood in the urine  pain in the lower back or side  pain when urinating  symptoms of high calcium levels: thirst, severe or continued nausea or vomiting, constipation, abdominal pain, muscle weakness, fatigue, confusion, or difficulty in concentration Side effects that usually do not require medical attention (report to your doctor or health care professional if they continue or are bothersome):  mild constipation This list may not describe all possible side effects. Call your doctor for medical advice about side effects. You may report side effects to FDA at 1-800-FDA-1088. Where should I keep my medicine? Keep out of the reach of children. Store at  room temperature between 15 and 30 degrees C (59 and 86 degrees F). Throw away any unused medicine after the expiration date. NOTE: This sheet is a summary. It may not cover all possible information. If you have questions about this medicine, talk to your doctor,  pharmacist, or health care provider.  2020 Elsevier/Gold Standard (2017-03-19 13:32:54)

## 2020-06-24 ENCOUNTER — Telehealth: Payer: Self-pay

## 2020-06-24 NOTE — Telephone Encounter (Signed)
Spoke with patient. No additional refills needed. Patient has spoken with pharmacy. Questions answered about acyclovir Rx.   Encounter closed.

## 2020-06-24 NOTE — Telephone Encounter (Signed)
Patient's prescription was sent in for a month's supply and she thought it would be for a year supply.

## 2020-06-24 NOTE — Telephone Encounter (Signed)
Patient notified we have her wellness screening form complete. The only thing we couldn't put on it was lipid screening results & fasting blood sugar. Patient is aware we didn't do those. She stated that was fine. She request that we mail it to her. Copy made for [place] to scan in & original mailed to patient.

## 2020-07-28 ENCOUNTER — Other Ambulatory Visit: Payer: Self-pay

## 2020-07-28 ENCOUNTER — Ambulatory Visit
Admission: RE | Admit: 2020-07-28 | Discharge: 2020-07-28 | Disposition: A | Discharge status: Home / Self Care | Payer: Managed Care, Other (non HMO) | Source: Ambulatory Visit | Attending: Obstetrics & Gynecology | Admitting: Obstetrics & Gynecology

## 2020-07-28 DIAGNOSIS — Z1231 Encounter for screening mammogram for malignant neoplasm of breast: Secondary | ICD-10-CM

## 2020-09-20 ENCOUNTER — Ambulatory Visit: Payer: Managed Care, Other (non HMO) | Admitting: Obstetrics and Gynecology

## 2020-10-04 ENCOUNTER — Ambulatory Visit: Payer: Self-pay | Admitting: Cardiology

## 2020-10-05 ENCOUNTER — Telehealth: Payer: Self-pay

## 2020-10-05 NOTE — Telephone Encounter (Signed)
NOTES ON FILE FROM [facility] [phone] SENT REFERRAL TO SCHEDULING 

## 2020-10-12 NOTE — Progress Notes (Unsigned)
CARDIOLOGY CONSULT NOTE       Patient ID: Shelby Barnes MRN: [id number] DOB/AGE: 04/23/1961 60 y.o.  Admit date: (Not on file) Referring Physician: Shelia Media Primary Physician: Rochel Brome, MD Primary Cardiologist: New Reason for Consultation: CAD  Active Problems:   * No active hospital problems. *   HPI:  60 y.o. referred by Dr Shelia Media for CAD. She sees [place] as well and is on testosterone pellets and oral progesterone for insomnia.  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 She is divorced Tried on crestor in past and had diffuse myalgias. Positive family history of premature CAD/Stroke LDL 184 08/12/20   Calcium score done at [facility] showed total score 24 1- RCA and 23 - circumflex  Using the MESA calcium calculator this is 87 th percentile for age and sex  She has no cardiac symptoms   She takes lots of supplements for her immune system herself Does not want COVID vaccine  Was on crestor ( dose not known) many years ago with fatigue and diffuse mylagias after only a couple weeks Has not been tried on another   Discussed her family history need for A1c and high percentile calcium score in this context.  She is unlikely to get her LDL under 100 with diet alone or zetia.   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 Anxiety   . Binge eating   . Depression   . Dysmenorrhea   . Fibroid   . Genital HSV 1981  . Hypercholesterolemia   . Insomnia   . Kidney stones 2001  . Low back pain   . Pelvic pain   . Sinusitis    . STD (sexually transmitted disease) 1981   herpes    Family History  Problem Relation Age of Onset  . Diabetes Mother 46       Whipple Procedure from cyst on pancrease  . Heart failure Father   . Diabetes Maternal Grandmother   . Stroke Paternal Grandmother   . Heart failure Paternal Grandfather   . Breast cancer Maternal Aunt 70       with metasis    Social History   Socioeconomic History  . Marital status: Divorced    Spouse name: Not on file  . Number of children: Not on file  . Years of education: Not on file  . Highest education level: Not on file  Occupational History  . Not on file  Tobacco Use  . Smoking status: Never Smoker  . Smokeless tobacco: Never Used  Substance and Sexual Activity  . Alcohol use: Yes    Comment: 1 a week  . Drug use: No  . Sexual activity: Not Currently    Partners: Male    Birth control/protection: Post-menopausal  Other Topics Concern  . Not on file  Social History Narrative  . Not on file   Social Determinants of Health   Financial Resource Strain: Not on file  Food Insecurity: Not on file  Transportation Needs: Not on file  Physical Activity:  Not on file  Stress: Not on file  Social Connections: Not on file  Intimate Partner Violence: Not on file    Past Surgical History:  Procedure Laterality Date  . BREAST EXCISIONAL BIOPSY Left 1998   Fibroadenoma  . 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 COLONOSCOPY  05/2011      Current Outpatient Medications:  .  acyclovir (ZOVIRAX) 400 MG tablet, Two tablet daily for HSV suppression, Disp: 180 tablet, Rfl: 4 .  ALPRAZolam (XANAX) 1 MG tablet, Take 1 mg by mouth at bedtime as needed., Disp: , Rfl:  .  omeprazole (PRILOSEC) 40 MG capsule, as needed., Disp: , Rfl:  .  progesterone (PROMETRIUM) 100 MG capsule, TAKE 1 TO 2 CAPSULES BY MOUTH EVERY NIGHT AT BEDTIME, Disp: , Rfl:  .  UNABLE TO FIND, Bio-identical testosterone pellet, Disp: , Rfl:  .  zolpidem (AMBIEN CR) 12.5  MG CR tablet, Take 12.5 mg by mouth at bedtime as needed., Disp: , Rfl:     Physical Exam: Last menstrual period 07/21/2014.   Affect appropriate Healthy:  appears stated age 53: normal Neck supple with no adenopathy JVP normal no bruits no thyromegaly Lungs clear with no wheezing and good diaphragmatic motion Heart:  S1/S2 no murmur, no rub, gallop or click PMI normal Abdomen: benighn, BS positve, no tenderness, no AAA no bruit.  No HSM or HJR Distal pulses intact with no bruits No edema Neuro non-focal Skin warm and dry No muscular weakness   Labs:  No results found for: WBC, HGB, HCT, MCV, PLT No results for input(s): NA, K, CL, CO2, BUN, CREATININE, CALCIUM, PROT, BILITOT, ALKPHOS, ALT, AST, GLUCOSE in the last 168 hours.  Invalid input(s): LABALBU No results found for: CKTOTAL, CKMB, CKMBINDEX, TROPONINI No results found for: CHOL No results found for: HDL No results found for: LDLCALC No results found for: TRIG No results found for: CHOLHDL No results found for: LDLDIRECT    Radiology: No results found.  EKG: 10/14/2020 NSR normal ECG 10/14/2020 NSR rate 88 nonspecific ST changes low voltage    ASSESSMENT AND PLAN:   1. HLD:  Patient prefers trial of red yeast rice and niacin. Will f/u in 3 months  2. CAD:  High calcium score for age Family history of premature CAD Started on 81 mg ASA by primary   3. Pre Diabetes:  She indicated elevated fasting BS primary has not checked A1c will do today   F/U in 3 months  I also discussed with lipid clinic Pharm D Marcelle Overlie and she agreed standard of care is to fail At least two statins with side effects before considering alternatives and most insurance companies Would require this as well   Signed: Jenkins Rouge 10/14/2020, 3:53 PM

## 2020-10-14 ENCOUNTER — Encounter: Payer: Self-pay | Admitting: Cardiovascular Disease

## 2020-10-14 ENCOUNTER — Other Ambulatory Visit: Payer: Self-pay

## 2020-10-14 ENCOUNTER — Ambulatory Visit: Payer: Managed Care, Other (non HMO) | Admitting: Cardiovascular Disease

## 2020-10-14 VITALS — BP 112/82 | HR 88 | Ht 63.5 in | Wt 193.0 lb

## 2020-10-14 DIAGNOSIS — E782 Mixed hyperlipidemia: Secondary | ICD-10-CM

## 2020-10-14 DIAGNOSIS — R931 Abnormal findings on diagnostic imaging of heart and coronary circulation: Secondary | ICD-10-CM

## 2020-10-14 DIAGNOSIS — R739 Hyperglycemia, unspecified: Secondary | ICD-10-CM | POA: Diagnosis not present

## 2020-10-14 NOTE — Patient Instructions (Addendum)
Medication Instructions:  Your physician recommends that you continue on your current medications as directed. Please refer to the Current Medication list given to you today.  *If you need a refill on your cardiac medications before your next appointment, please call your pharmacy*   Lab Work: Your physician recommends that you have lab work today- Hgb A1c  If you have labs (blood work) drawn today and your tests are completely normal, you will receive your results only by: Marland Kitchen MyChart Message (if you have MyChart) OR . A paper copy in the mail If you have any lab test that is abnormal or we need to change your treatment, we will call you to review the results.   Follow-Up: At [facility], you and your health needs are our priority.  As part of our continuing mission to provide you with exceptional heart care, we have created designated Provider Care Teams.  These Care Teams include your primary Cardiologist (physician) and Advanced Practice Providers (APPs -  Physician Assistants and Nurse Practitioners) who all work together to provide you with the care you need, when you need it.  We recommend signing up for the patient portal called "MyChart".  Sign up information is provided on this After Visit Summary.  MyChart is used to connect with patients for Virtual Visits (Telemedicine).  Patients are able to view lab/test results, encounter notes, upcoming appointments, etc.  Non-urgent messages can be sent to your provider as well.   To learn more about what you can do with MyChart, go to [email or web address].    Your next appointment:   3 month(s)  The format for your next appointment:   In Person  Provider:   You may see Dr. Johnsie Cancel or one of the following Advanced Practice Providers on your designated Care Team:    Kathyrn Drown, NP

## 2020-10-15 LAB — HEMOGLOBIN A1C
Est. average glucose Bld gHb Est-mCnc: 117 mg/dL
Hgb A1c MFr Bld: 5.7 % — ABNORMAL HIGH (ref 4.8–5.6)

## 2020-10-19 ENCOUNTER — Telehealth: Payer: Self-pay | Admitting: Cardiovascular Disease

## 2020-10-19 NOTE — Telephone Encounter (Signed)
Follow Up:    Pt is returning your call from yesterday 

## 2020-10-19 NOTE — Telephone Encounter (Signed)
Patient aware of results.

## 2020-11-26 ENCOUNTER — Ambulatory Visit: Payer: Managed Care, Other (non HMO) | Admitting: Cardiovascular Disease

## 2020-12-13 ENCOUNTER — Ambulatory Visit: Payer: BC Managed Care – PPO | Admitting: Family Medicine

## 2020-12-13 ENCOUNTER — Other Ambulatory Visit: Payer: Self-pay

## 2020-12-13 ENCOUNTER — Encounter: Payer: Self-pay | Admitting: Family Medicine

## 2020-12-13 VITALS — BP 128/76 | HR 77 | Temp 97.6°F | Ht 64.0 in | Wt 188.0 lb

## 2020-12-13 DIAGNOSIS — Z1211 Encounter for screening for malignant neoplasm of colon: Secondary | ICD-10-CM

## 2020-12-13 DIAGNOSIS — E782 Mixed hyperlipidemia: Secondary | ICD-10-CM

## 2020-12-13 DIAGNOSIS — F5101 Primary insomnia: Secondary | ICD-10-CM | POA: Diagnosis not present

## 2020-12-13 DIAGNOSIS — R7303 Prediabetes: Secondary | ICD-10-CM | POA: Diagnosis not present

## 2020-12-13 MED ORDER — METFORMIN HCL 500 MG PO TABS
500.0000 mg | ORAL_TABLET | Freq: Every day | ORAL | 0 refills | Status: DC
Start: 2020-12-13 — End: 2021-03-11

## 2020-12-13 NOTE — Patient Instructions (Signed)
Diabetes Care, 44(Suppl 1), D40-C14. https://doi.org/https://doi.org/10.2337/dc21-S003">  Prediabetes Prediabetes is when your blood sugar (blood glucose) level is higher than normal but not high enough for you to be diagnosed with type 2 diabetes. Having prediabetes puts you at risk for developing type 2 diabetes (type 2 diabetes mellitus). With certain lifestyle changes, you may be able to prevent or delay the onset of type 2 diabetes. This is important because type 2 diabetes can lead to serious complications, such as:  Heart disease.  Stroke.  Blindness.  Kidney disease.  Depression.  Poor circulation in the feet and legs. In severe cases, this could lead to surgical removal of a leg (amputation). What are the causes? The exact cause of prediabetes is not known. It may result from insulin resistance. Insulin resistance develops when cells in the body do not respond properly to insulin that the body makes. This can cause excess glucose to build up in the blood. High blood glucose (hyperglycemia) can develop. What increases the risk? The following factors may make you more likely to develop this condition:  You have a family member with type 2 diabetes.  You are older than 45 years.  You had a temporary form of diabetes during a pregnancy (gestational diabetes).  You had polycystic ovary syndrome (PCOS).  You are overweight or obese.  You are inactive (sedentary).  You have a history of heart disease, including problems with cholesterol levels, high levels of blood fats, or high blood pressure. What are the signs or symptoms? You may have no symptoms. If you do have symptoms, they may include:  Increased hunger.  Increased thirst.  Increased urination.  Vision changes, such as blurry vision.  Tiredness (fatigue). How is this diagnosed? This condition can be diagnosed with blood tests. Your blood glucose may be checked with one or more of the following tests:  A  fasting blood glucose (FBG) test. You will not be allowed to eat (you will fast) for at least 8 hours before a blood sample is taken.  An A1C blood test (hemoglobin A1C). This test provides information about blood glucose levels over the previous 2?3 months.  An oral glucose tolerance test (OGTT). This test measures your blood glucose at two points in time: ? After fasting. This is your baseline level. ? Two hours after you drink a beverage that contains glucose. You may be diagnosed with prediabetes if:  Your FBG is 100?125 mg/dL (5.6-6.9 mmol/L).  Your A1C level is 5.7?6.4% (39-46 mmol/mol).  Your OGTT result is 140?199 mg/dL (7.8-11 mmol/L). These blood tests may be repeated to confirm your diagnosis.   How is this treated? Treatment may include dietary and lifestyle changes to help lower your blood glucose and prevent type 2 diabetes from developing. In some cases, medicine may be prescribed to help lower the risk of type 2 diabetes. Follow these instructions at home: Nutrition  Follow a healthy meal plan. This includes eating lean proteins, whole grains, legumes, fresh fruits and vegetables, low-fat dairy products, and healthy fats.  Follow instructions from your health care provider about eating or drinking restrictions.  Meet with a dietitian to create a healthy eating plan that is right for you.   Lifestyle  Do moderate-intensity exercise for at least 30 minutes a day on 5 or more days each week, or as told by your health care provider. A mix of activities may be best, such as: ? Brisk walking, swimming, biking, and weight lifting.  Lose weight as told by your health  care provider. Losing 5-7% of your body weight can reverse insulin resistance.  Do not drink alcohol if: ? Your health care provider tells you not to drink. ? You are pregnant, may be pregnant, or are planning to become pregnant.  If you drink alcohol: ? Limit how much you use to:  0-1 drink a day for  women.  0-2 drinks a day for men. ? Be aware of how much alcohol is in your drink. In the [place] one drink equals one 12 oz bottle of beer (355 mL), one 5 oz glass of wine (148 mL), or one 1 oz glass of hard liquor (44 mL). General instructions  Take over-the-counter and prescription medicines only as told by your health care provider. You may be prescribed medicines that help lower the risk of type 2 diabetes.  Do not use any products that contain nicotine or tobacco, such as cigarettes, e-cigarettes, and chewing tobacco. If you need help quitting, ask your health care provider.  Keep all follow-up visits. This is important. Where to find more information  American Diabetes Association: www.diabetes.org  Academy of Nutrition and Dietetics: www.eatright.org  American Heart Association: www.heart.org Contact a health care provider if:  You have any of these symptoms: ? Increased hunger. ? Increased urination. ? Increased thirst. ? Fatigue. ? Vision changes, such as blurry vision. Get help right away if you:  Have shortness of breath.  Feel confused.  Vomit or feel like you may vomit. Summary  Prediabetes is when your blood sugar (blood glucose)level is higher than normal but not high enough for you to be diagnosed with type 2 diabetes.  Having prediabetes puts you at risk for developing type 2 diabetes (type 2 diabetes mellitus).  Make lifestyle changes such as eating a healthy diet and exercising regularly to help prevent diabetes. Lose weight as told by your health care provider. This information is not intended to replace advice given to you by your health care provider. Make sure you discuss any questions you have with your health care provider. Document Revised: 11/06/2019 Document Reviewed: 11/06/2019 Elsevier Patient Education  2021 Elsevier Inc.  

## 2020-12-13 NOTE — Progress Notes (Signed)
New Patient Office Visit  Subjective:  Patient ID: Shelby Barnes, female    DOB: 1960/10/06  Age: 60 y.o. MRN: [id number]  CC:  Chief Complaint  Patient presents with  . Establish Care    HPI Shelby Barnes presents to establish care. States her previous PCP wanted to put her on Ozempic, however she was/is not diabetic. She also would like to lose weight. Typically eats 1-2 meals a day, exercises daily and drinks water. Avoiding carbohydrates and sugar.   Her previous pcp did a cardiac calcium score was elevated.   Hyperlipidemia: LDL 184. Tried crestor..body aches. Pt is on niacin and red yeast rice.  Past Medical History:  Diagnosis Date  . Anxiety   . Binge eating   . Depression   . Dysmenorrhea   . Fibroid   . Genital HSV 1981  . Hypercholesterolemia   . Insomnia   . Kidney stones 2001  . Low back pain   . Pelvic pain   . Sinusitis   . STD (sexually transmitted disease) 1981   herpes    Past Surgical History:  Procedure Laterality Date  . BREAST EXCISIONAL BIOPSY Left 1998   Fibroadenoma  . 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 COLONOSCOPY  05/2011    Family History  Problem Relation Age of Onset  . Diabetes Mother 66       Whipple Procedure from cyst on pancrease  . Heart failure Father   . Diabetes Maternal Grandmother   . Stroke Paternal Grandmother   . Heart failure Paternal Grandfather   . Breast cancer Maternal Aunt 70       with metasis    Social History   Socioeconomic History  . Marital status: Divorced    Spouse name: Not on file  . Number of children: 0  . Years of education: Not on file  . Highest education level: Not on file  Occupational History  . Occupation: Office manager  Tobacco Use  . Smoking status: Never Smoker  . Smokeless tobacco: Never Used  Substance and Sexual Activity  . Alcohol use: Yes    Alcohol/week: 1.0 standard drink    Types: 1 Standard drinks or equivalent per week    Comment: 1 a  week  . Drug use: No  . Sexual activity: Not Currently    Partners: Male    Birth control/protection: Post-menopausal  Other Topics Concern  . Not on file  Social History Narrative  . Not on file   Social Determinants of Health   Financial Resource Strain: Not on file  Food Insecurity: Not on file  Transportation Needs: Not on file  Physical Activity: Not on file  Stress: Not on file  Social Connections: Not on file  Intimate Partner Violence: Not on file    ROS Review of Systems  Constitutional: Negative for chills, fatigue and fever.       Weight has plateaued.  Has never taken weight loss.   HENT: Negative for congestion, ear pain and sore throat.   Respiratory: Negative for cough and shortness of breath.   Cardiovascular: Negative for chest pain.  Gastrointestinal: Positive for abdominal pain (some cramping. ), constipation (either one extreme or the other. eating yogurt with psyllium and chia seeds. ) and diarrhea. Negative for nausea and vomiting.       Jerrye Bushy. Usually with tomato base, but now awakens at night and has raises her bed up.    Endocrine: Negative for polydipsia, polyphagia and  polyuria.  Genitourinary: Negative for dysuria and urgency.  Musculoskeletal: Negative for arthralgias and myalgias.  Skin: Negative for rash.  Neurological: Negative for dizziness and headaches.  Psychiatric/Behavioral: Positive for sleep disturbance (alternates xanax with ambien. ). Negative for dysphoric mood. The patient is not nervous/anxious.     Objective:   Today's Vitals: BP 128/76   Pulse 77   Temp 97.6 F (36.4 C)   Ht 5\' 4"  (1.626 m)   Wt 188 lb (85.3 kg)   LMP 07/21/2014   SpO2 99%   BMI 32.27 kg/m   Physical Exam Vitals reviewed.  Constitutional:      Appearance: Normal appearance.  Neck:     Vascular: No carotid bruit.  Cardiovascular:     Rate and Rhythm: Normal rate and regular rhythm.     Heart sounds: Normal heart sounds.  Pulmonary:     Effort:  Pulmonary effort is normal.     Breath sounds: Normal breath sounds.  Abdominal:     General: Bowel sounds are normal.     Palpations: Abdomen is soft.     Tenderness: There is no abdominal tenderness.  Neurological:     Mental Status: She is alert and oriented to person, place, and time.  Psychiatric:        Mood and Affect: Mood normal.        Behavior: Behavior normal.    Assessment & Plan:  1. Mixed hyperlipidemia Not at goal. Pt is not fasting. Follow up and will recheck.  Continue red yeast rice and niacin for now.   2. Prediabetes - metFORMIN (GLUCOPHAGE) 500 MG tablet; Take 1 tablet (500 mg total) by mouth daily with breakfast.  Dispense: 90 tablet; Refill: 0  3. Colon cancer screening - Ambulatory referral to Gastroenterology  4. Primary insomnia Continue alternating ambien and xanax monthly Will review previous pcp records.   5. Obesity BMI 32 Trial on metformin as some people have weight loss with this.     Outpatient Encounter Medications as of 12/13/2020  Medication Sig  . metFORMIN (GLUCOPHAGE) 500 MG tablet Take 1 tablet (500 mg total) by mouth daily with breakfast.  . Red Yeast Rice 600 MG TABS Take 600 mg by mouth in the morning and at bedtime.  Marland Kitchen acyclovir (ZOVIRAX) 400 MG tablet Two tablet daily for HSV suppression  . ALPRAZolam (XANAX) 1 MG tablet Take 1 mg by mouth at bedtime as needed.  Marland Kitchen aspirin 81 MG chewable tablet Chew 81 mg by mouth daily.  . Cholecalciferol (D3 PO) Take 1 tablet by mouth daily.  . Multiple Vitamin (MULTIVITAMIN WITH MINERALS) TABS tablet Take 1 tablet by mouth daily.  . niacin 500 MG tablet Take 500 mg by mouth at bedtime.  . progesterone (PROMETRIUM) 100 MG capsule TAKE 1 TO 2 CAPSULES BY MOUTH EVERY NIGHT AT BEDTIME  . UBIQUINOL LIPOSOMAL PO Take 1,600 mg by mouth daily.  Marland Kitchen UNABLE TO FIND Bio-identical testosterone pellet  . Zinc Acetate, Oral, (ZINC ACETATE PO) Take 1 tablet by mouth daily.  Marland Kitchen zolpidem (AMBIEN CR) 12.5 MG  CR tablet Take 12.5 mg by mouth at bedtime as needed.   No facility-administered encounter medications on file as of 12/13/2020.    Follow-up: Return in about 1 month (around 01/12/2021) for fasting.   Rochel Brome, MD

## 2020-12-15 ENCOUNTER — Encounter: Payer: Self-pay | Admitting: Family Medicine

## 2020-12-31 DIAGNOSIS — F411 Generalized anxiety disorder: Secondary | ICD-10-CM | POA: Diagnosis not present

## 2020-12-31 DIAGNOSIS — F339 Major depressive disorder, recurrent, unspecified: Secondary | ICD-10-CM | POA: Diagnosis not present

## 2020-12-31 DIAGNOSIS — F5101 Primary insomnia: Secondary | ICD-10-CM | POA: Diagnosis not present

## 2021-01-12 ENCOUNTER — Other Ambulatory Visit: Payer: BC Managed Care – PPO

## 2021-01-12 DIAGNOSIS — R7303 Prediabetes: Secondary | ICD-10-CM | POA: Diagnosis not present

## 2021-01-12 DIAGNOSIS — E782 Mixed hyperlipidemia: Secondary | ICD-10-CM

## 2021-01-12 NOTE — Progress Notes (Signed)
Subjective:  Patient ID: Shelby Barnes, female    DOB: 11/22/1960  Age: 60 y.o. MRN: [id number]  Chief Complaint  Patient presents with  . Hyperlipidemia    HPI  Mixed hyperlipidemia Red yeast rice 600 mg daily  Prediabetes Metformin 500 mg daily   Primary Insomnia Ambien 12.5 qhs Current Outpatient Medications on File Prior to Visit  Medication Sig Dispense Refill  . acyclovir (ZOVIRAX) 400 MG tablet Two tablet daily for HSV suppression 180 tablet 4  . ALPRAZolam (XANAX) 1 MG tablet Take 1 mg by mouth at bedtime as needed.    Marland Kitchen aspirin 81 MG chewable tablet Chew 81 mg by mouth daily.    . Cholecalciferol (D3 PO) Take 1 tablet by mouth daily.    . metFORMIN (GLUCOPHAGE) 500 MG tablet Take 1 tablet (500 mg total) by mouth daily with breakfast. 90 tablet 0  . Multiple Vitamin (MULTIVITAMIN WITH MINERALS) TABS tablet Take 1 tablet by mouth daily.    . niacin 500 MG tablet Take 500 mg by mouth at bedtime.    . progesterone (PROMETRIUM) 100 MG capsule TAKE 1 TO 2 CAPSULES BY MOUTH EVERY NIGHT AT BEDTIME    . Red Yeast Rice 600 MG TABS Take 600 mg by mouth in the morning and at bedtime.    Marland Kitchen UBIQUINOL LIPOSOMAL PO Take 1,600 mg by mouth daily.    Marland Kitchen UNABLE TO FIND Bio-identical testosterone pellet    . Zinc Acetate, Oral, (ZINC ACETATE PO) Take 1 tablet by mouth daily.    Marland Kitchen zolpidem (AMBIEN CR) 12.5 MG CR tablet Take 12.5 mg by mouth at bedtime as needed.     No current facility-administered medications on file prior to visit.   Past Medical History:  Diagnosis Date  . Anxiety   . Binge eating   . Depression   . Dysmenorrhea   . Fibroid   . Genital HSV 1981  . Hypercholesterolemia   . Insomnia   . Kidney stones 2001  . Low back pain   . Pelvic pain   . Sinusitis   . STD (sexually transmitted disease) 1981   herpes   Past Surgical History:  Procedure Laterality Date  . BREAST EXCISIONAL BIOPSY Left 1998   Fibroadenoma  . BREAST SURGERY Left 09/1996   fibroadenema   . COLONOSCOPY  05/2011    Family History  Problem Relation Age of Onset  . Diabetes Mother 9       Whipple Procedure from cyst on pancrease  . Heart failure Father   . Diabetes Maternal Grandmother   . Stroke Paternal Grandmother   . Heart failure Paternal Grandfather   . Breast cancer Maternal Aunt 70       with metasis   Social History   Socioeconomic History  . Marital status: Divorced    Spouse name: Not on file  . Number of children: 0  . Years of education: Not on file  . Highest education level: Not on file  Occupational History  . Occupation: Office manager  Tobacco Use  . Smoking status: Never Smoker  . Smokeless tobacco: Never Used  Substance and Sexual Activity  . Alcohol use: Yes    Alcohol/week: 1.0 standard drink    Types: 1 Standard drinks or equivalent per week    Comment: 1 a week  . Drug use: No  . Sexual activity: Not Currently    Partners: Male    Birth control/protection: Post-menopausal  Other Topics Concern  . Not on  file  Social History Narrative  . Not on file   Social Determinants of Health   Financial Resource Strain: Not on file  Food Insecurity: Not on file  Transportation Needs: Not on file  Physical Activity: Not on file  Stress: Not on file  Social Connections: Not on file    Review of Systems  Constitutional: Negative for chills, fatigue and fever.  HENT: Negative for congestion, ear pain, rhinorrhea and sore throat.   Respiratory: Negative for cough and shortness of breath.   Cardiovascular: Negative for chest pain.  Gastrointestinal: Negative for abdominal pain, constipation, diarrhea, nausea and vomiting.  Genitourinary: Negative for dysuria and urgency.  Musculoskeletal: Negative for back pain and myalgias.  Neurological: Negative for dizziness, weakness, light-headedness and headaches.  Psychiatric/Behavioral: Negative for dysphoric mood. The patient is not nervous/anxious.      Objective:  LMP  07/21/2014   BP/Weight 12/13/2020 10/14/2020 71/0/6269  Systolic BP 485 462 703  Diastolic BP 76 82 74  Wt. (Lbs) 188 193 188  BMI 32.27 33.65 32.78    Physical Exam Vitals reviewed.  Constitutional:      Appearance: Normal appearance. She is normal weight.  Neck:     Vascular: No carotid bruit.  Cardiovascular:     Rate and Rhythm: Normal rate and regular rhythm.     Pulses: Normal pulses.     Heart sounds: Normal heart sounds.  Pulmonary:     Effort: Pulmonary effort is normal. No respiratory distress.     Breath sounds: Normal breath sounds.  Abdominal:     General: Abdomen is flat. Bowel sounds are normal.     Palpations: Abdomen is soft.     Tenderness: There is no abdominal tenderness.  Neurological:     Mental Status: She is alert and oriented to person, place, and time.  Psychiatric:        Mood and Affect: Mood normal.        Behavior: Behavior normal.     Diabetic Foot Exam - Simple   No data filed      Lab Results  Component Value Date   HGBA1C 5.7 (H) 10/14/2020      Assessment & Plan:   1. Mixed hyperlipidemia  2. Prediabetes    No orders of the defined types were placed in this encounter.   No orders of the defined types were placed in this encounter.    Follow-up: No follow-ups on file.  An After Visit Summary was printed and given to the patient.  Rochel Brome, MD [facility] [phone]    I,Katherina A Bramblett,acting as a scribe for Rochel Brome, MD.,have documented all relevant documentation on the behalf of Rochel Brome, MD,as directed by  Rochel Brome, MD while in the presence of Rochel Brome, MD.

## 2021-01-13 ENCOUNTER — Encounter: Payer: Self-pay | Admitting: Family Medicine

## 2021-01-13 LAB — COMPREHENSIVE METABOLIC PANEL
ALT: 7 IU/L (ref 0–32)
AST: 16 IU/L (ref 0–40)
Albumin/Globulin Ratio: 2.1 (ref 1.2–2.2)
Albumin: 4.6 g/dL (ref 3.8–4.8)
Alkaline Phosphatase: 90 IU/L (ref 44–121)
BUN/Creatinine Ratio: 20 (ref 12–28)
BUN: 18 mg/dL (ref 8–27)
Bilirubin Total: 0.4 mg/dL (ref 0.0–1.2)
CO2: 21 mmol/L (ref 20–29)
Calcium: 9.7 mg/dL (ref 8.7–10.3)
Chloride: 100 mmol/L (ref 96–106)
Creatinine, Ser: 0.92 mg/dL (ref 0.57–1.00)
Globulin, Total: 2.2 g/dL (ref 1.5–4.5)
Glucose: 121 mg/dL — ABNORMAL HIGH (ref 65–99)
Potassium: 4.5 mmol/L (ref 3.5–5.2)
Sodium: 140 mmol/L (ref 134–144)
Total Protein: 6.8 g/dL (ref 6.0–8.5)
eGFR: 71 mL/min/{1.73_m2} (ref >59)

## 2021-01-13 LAB — LIPID PANEL
Chol/HDL Ratio: 4.3 ratio (ref 0.0–4.4)
Cholesterol, Total: 231 mg/dL — ABNORMAL HIGH (ref 100–199)
HDL: 54 mg/dL (ref >39)
LDL Chol Calc (NIH): 155 mg/dL — ABNORMAL HIGH (ref 0–99)
Triglycerides: 122 mg/dL (ref 0–149)
VLDL Cholesterol Cal: 22 mg/dL (ref 5–40)

## 2021-01-13 LAB — CBC WITH DIFF/PLATELET
Basophils Absolute: 0.1 10*3/uL (ref 0.0–0.2)
Basos: 1 %
EOS (ABSOLUTE): 0.3 10*3/uL (ref 0.0–0.4)
Eos: 4 %
Hematocrit: 47.6 % — ABNORMAL HIGH (ref 34.0–46.6)
Hemoglobin: 15.8 g/dL (ref 11.1–15.9)
Immature Grans (Abs): 0 10*3/uL (ref 0.0–0.1)
Immature Granulocytes: 1 %
Lymphocytes Absolute: 1.6 10*3/uL (ref 0.7–3.1)
Lymphs: 21 %
MCH: 32.3 pg (ref 26.6–33.0)
MCHC: 33.2 g/dL (ref 31.5–35.7)
MCV: 97 fL (ref 79–97)
Monocytes Absolute: 0.5 10*3/uL (ref 0.1–0.9)
Monocytes: 6 %
Neutrophils Absolute: 5 10*3/uL (ref 1.4–7.0)
Neutrophils: 67 %
Platelets: 257 10*3/uL (ref 150–450)
RBC: 4.89 x10E6/uL (ref 3.77–5.28)
RDW: 12.8 % (ref 11.7–15.4)
WBC: 7.5 10*3/uL (ref 3.4–10.8)

## 2021-01-13 LAB — CARDIOVASCULAR RISK ASSESSMENT

## 2021-01-13 LAB — HEMOGLOBIN A1C
Est. average glucose Bld gHb Est-mCnc: 120 mg/dL
Hgb A1c MFr Bld: 5.8 % — ABNORMAL HIGH (ref 4.8–5.6)

## 2021-01-13 LAB — TSH: TSH: 1.02 u[IU]/mL (ref 0.450–4.500)

## 2021-01-14 ENCOUNTER — Ambulatory Visit: Payer: BC Managed Care – PPO | Admitting: Family Medicine

## 2021-01-14 ENCOUNTER — Other Ambulatory Visit: Payer: Self-pay

## 2021-01-14 ENCOUNTER — Encounter: Payer: Self-pay | Admitting: Family Medicine

## 2021-01-14 VITALS — BP 128/72 | HR 93 | Temp 96.7°F | Ht 64.0 in | Wt 183.0 lb

## 2021-01-14 DIAGNOSIS — Z6831 Body mass index (BMI) 31.0-31.9, adult: Secondary | ICD-10-CM | POA: Diagnosis not present

## 2021-01-14 DIAGNOSIS — F5101 Primary insomnia: Secondary | ICD-10-CM | POA: Diagnosis not present

## 2021-01-14 DIAGNOSIS — E782 Mixed hyperlipidemia: Secondary | ICD-10-CM

## 2021-01-14 DIAGNOSIS — N3001 Acute cystitis with hematuria: Secondary | ICD-10-CM

## 2021-01-14 DIAGNOSIS — R7303 Prediabetes: Secondary | ICD-10-CM | POA: Diagnosis not present

## 2021-01-14 LAB — POCT URINALYSIS DIP (CLINITEK)
Bilirubin, UA: NEGATIVE
Glucose, UA: NEGATIVE mg/dL
Ketones, POC UA: NEGATIVE mg/dL
Nitrite, UA: NEGATIVE
Spec Grav, UA: 1.01 (ref 1.010–1.025)
Urobilinogen, UA: NEGATIVE E.U./dL — AB
pH, UA: 6.5 (ref 5.0–8.0)

## 2021-01-14 MED ORDER — NITROFURANTOIN MONOHYD MACRO 100 MG PO CAPS: 100.0000 mg | ORAL_CAPSULE | Freq: Two times a day (BID) | ORAL | 0 refills | Status: DC

## 2021-01-14 MED ORDER — OZEMPIC (0.25 OR 0.5 MG/DOSE) 2 MG/1.5ML ~~LOC~~ SOPN: 0.5000 mg | PEN_INJECTOR | SUBCUTANEOUS | 0 refills | Status: DC

## 2021-01-14 NOTE — Patient Instructions (Signed)
Start on ozempic 0.25 mg once weekly x 4 weeks. If tolerating, may increase to 0.5 mg once weekly.

## 2021-01-20 ENCOUNTER — Other Ambulatory Visit: Payer: Self-pay

## 2021-01-20 LAB — URINE CULTURE

## 2021-01-20 MED ORDER — CEFDINIR 300 MG PO CAPS: 300.0000 mg | ORAL_CAPSULE | Freq: Two times a day (BID) | ORAL | 0 refills | Status: DC

## 2021-02-22 NOTE — Progress Notes (Signed)
CARDIOLOGY CONSULT NOTE       Patient ID: Shelby Barnes MRN: [id number] DOB/AGE: 12-12-1960 60 y.o.  Admit date: (Not on file) Referring Physician: Shelia Media Primary Physician: Shelby Brome, MD Primary Cardiologist: New Reason for Consultation: CAD  Active Problems:   * No active hospital problems. *   HPI:  60 y.o. referred by Dr Shelia Media for CAD. First seen on 10/14/20 She sees [facility] as well and is on testosterone pellets and oral progesterone for insomnia.  She is not vaccinated Tried on crestor in past and had diffuse myalgias. Positive family history of premature CAD/Stroke LDL 184 08/12/20   Calcium score done at [facility] showed total score 24 1- RCA and 23 - circumflex  Using the MESA calcium calculator this is 34 th percentile for age and sex  She has no cardiac symptoms   She takes lots of supplements for her immune system herself Does not want COVID vaccine  Was on crestor ( dose not known) many years ago with fatigue and diffuse mylagias after only a couple weeks Has not been tried on another   Discussed her family history need for A1c and high percentile calcium score in this context.  She is unlikely to get her LDL under 100 with diet alone or zetia.   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 Taking DM seriously but still not wanting to Rx LDL effectively   ROS All other systems reviewed and negative except as noted above  Past Medical History:  Diagnosis Date    Anxiety    Binge eating    Depression    Dysmenorrhea    Fibroid    Genital HSV 1981   Hypercholesterolemia    Insomnia    Kidney stones 2001   Low back pain    Pelvic pain    Sinusitis    STD (sexually transmitted disease) 1981   herpes    Family History  Problem Relation Age of Onset   Diabetes Mother 86       Whipple Procedure from cyst on pancrease   Heart failure Father    Diabetes Maternal Grandmother    Stroke Paternal Grandmother    Heart failure Paternal Grandfather    Breast cancer Maternal Aunt 70       with metasis    Social History   Socioeconomic History   Marital status: Divorced    Spouse name: Not on file   Number of children: 0   Years of education: Not on file   Highest education level: Not on file  Occupational History   Occupation: Office manager  Tobacco Use   Smoking status: Never   Smokeless tobacco: Never  Substance and Sexual Activity   Alcohol use: Yes    Alcohol/week: 1.0 standard drink    Types: 1 Standard drinks or equivalent per week    Comment: 1 a week   Drug use: No   Sexual activity: Not Currently  Partners: Male    Birth control/protection: Post-menopausal  Other Topics Concern   Not on file  Social History Narrative   Not on file   Social Determinants of Health   Financial Resource Strain: Not on file  Food Insecurity: Not on file  Transportation Needs: Not on file  Physical Activity: Not on file  Stress: Not on file  Social Connections: Not on file  Intimate Partner Violence: Not on file    Past Surgical History:  Procedure Laterality Date   BREAST EXCISIONAL BIOPSY Left 1998   Fibroadenoma   BREAST SURGERY Left 09/1996   fibroadenema   COLONOSCOPY  05/2011      Current Outpatient Medications:    acyclovir (ZOVIRAX) 400 MG tablet, Two tablet daily for HSV suppression, Disp: 180 tablet, Rfl: 4   ALPRAZolam (XANAX) 1 MG tablet, Take 1 mg by mouth at bedtime as needed., Disp: , Rfl:    aspirin  81 MG chewable tablet, Chew 81 mg by mouth daily., Disp: , Rfl:    cefdinir (OMNICEF) 300 MG capsule, Take 1 capsule (300 mg total) by mouth 2 (two) times daily., Disp: 14 capsule, Rfl: 0   Cholecalciferol (D3 PO), Take 1 tablet by mouth daily., Disp: , Rfl:    metFORMIN (GLUCOPHAGE) 500 MG tablet, Take 1 tablet (500 mg total) by mouth daily with breakfast., Disp: 90 tablet, Rfl: 0   Multiple Vitamin (MULTIVITAMIN WITH MINERALS) TABS tablet, Take 1 tablet by mouth daily., Disp: , Rfl:    niacin 500 MG tablet, Take 500 mg by mouth at bedtime., Disp: , Rfl:    progesterone (PROMETRIUM) 100 MG capsule, TAKE 1 TO 2 CAPSULES BY MOUTH EVERY NIGHT AT BEDTIME, Disp: , Rfl:    Red Yeast Rice 600 MG TABS, Take 600 mg by mouth in the morning and at bedtime., Disp: , Rfl:    Semaglutide,0.25 or 0.5MG /DOS, (OZEMPIC, 0.25 OR 0.5 MG/DOSE,) 2 MG/1.5ML SOPN, Inject 0.5 mg into the skin once a week., Disp: 4.5 mL, Rfl: 0   UBIQUINOL LIPOSOMAL PO, Take 1,600 mg by mouth daily., Disp: , Rfl:    UNABLE TO FIND, Bio-identical testosterone pellet, Disp: , Rfl:    Zinc Acetate, Oral, (ZINC ACETATE PO), Take 1 tablet by mouth daily., Disp: , Rfl:    zolpidem (AMBIEN CR) 12.5 MG CR tablet, Take 12.5 mg by mouth at bedtime as needed., Disp: , Rfl:     Physical Exam: Blood pressure 128/74, pulse 84, height 5\' 4"  (1.626 m), weight 82.2 kg, last menstrual period 07/21/2014, SpO2 99 %.   Affect appropriate Healthy:  appears stated age 60: normal Neck supple with no adenopathy JVP normal no bruits no thyromegaly Lungs clear with no wheezing and good diaphragmatic motion Heart:  S1/S2 no murmur, no rub, gallop or click PMI normal Abdomen: benighn, BS positve, no tenderness, no AAA no bruit.  No HSM or HJR Distal pulses intact with no bruits No edema Neuro non-focal Skin warm and dry No muscular weakness   Labs:   Lab Results  Component Value Date   WBC 7.5 01/12/2021   HGB 15.8 01/12/2021   HCT 47.6 (H)  01/12/2021   MCV 97 01/12/2021   PLT 257 01/12/2021   No results for input(s): NA, K, CL, CO2, BUN, CREATININE, CALCIUM, PROT, BILITOT, ALKPHOS, ALT, AST, GLUCOSE in the last 168 hours.  Invalid input(s): LABALBU No results found for: CKTOTAL, CKMB, CKMBINDEX, TROPONINI  Lab Results  Component Value Date   CHOL 231 (H) 01/12/2021  Lab Results  Component Value Date   HDL 54 01/12/2021   Lab Results  Component Value Date   LDLCALC 155 (H) 01/12/2021   Lab Results  Component Value Date   TRIG 122 01/12/2021   Lab Results  Component Value Date   CHOLHDL 4.3 01/12/2021   No results found for: LDLDIRECT    Radiology: No results found.  EKG: 03/02/2021 NSR normal ECG 03/02/2021 NSR rate 88 nonspecific ST changes low voltage    ASSESSMENT AND PLAN:   1. HLD:  on Red Yeast rice LDL 155 refer to lipid clinic for Nexlitol or PSK 9 will call in lipitor 10 mg for her to try  As she would need to fail two statins  2. CAD:  High calcium score for age Family history of premature CAD Started on 81 mg ASA by primary   3. Diabetes:  On glucophage daily and Ozempic A1c normal low carb diet   F/U in 6 months   I also discussed with lipid clinic Pharm D Marcelle Overlie and she agreed standard of care is to fail At least two statins with side effects before considering alternatives and most insurance companies Would require this as well    Signed: Jenkins Rouge 03/02/2021, 8:52 AM

## 2021-03-02 ENCOUNTER — Encounter: Payer: Self-pay | Admitting: Cardiovascular Disease

## 2021-03-02 ENCOUNTER — Ambulatory Visit: Payer: BC Managed Care – PPO | Admitting: Cardiovascular Disease

## 2021-03-02 ENCOUNTER — Other Ambulatory Visit: Payer: Self-pay

## 2021-03-02 VITALS — BP 128/74 | HR 84 | Ht 64.0 in | Wt 181.2 lb

## 2021-03-02 DIAGNOSIS — I2510 Atherosclerotic heart disease of native coronary artery without angina pectoris: Secondary | ICD-10-CM

## 2021-03-02 DIAGNOSIS — E119 Type 2 diabetes mellitus without complications: Secondary | ICD-10-CM

## 2021-03-02 DIAGNOSIS — E782 Mixed hyperlipidemia: Secondary | ICD-10-CM

## 2021-03-02 DIAGNOSIS — Z794 Long term (current) use of insulin: Secondary | ICD-10-CM | POA: Diagnosis not present

## 2021-03-02 MED ORDER — ATORVASTATIN CALCIUM 10 MG PO TABS: 10.0000 mg | ORAL_TABLET | Freq: Every day | ORAL | 11 refills | Status: DC

## 2021-03-02 NOTE — Patient Instructions (Addendum)
Medication Instructions:  Your physician recommends that you continue on your current medications as directed. Please refer to the Current Medication list given to you today.  *If you need a refill on your cardiac medications before your next appointment, please call your pharmacy*  Lab Work: If you have labs (blood work) drawn today and your tests are completely normal, you will receive your results only by: [place] (if you have MyChart) OR A paper copy in the mail If you have any lab test that is abnormal or we need to change your treatment, we will call you to review the results.  Follow-Up: At [facility], you and your health needs are our priority.  As part of our continuing mission to provide you with exceptional heart care, we have created designated Provider Care Teams.  These Care Teams include your primary Cardiologist (physician) and Advanced Practice Providers (APPs -  Physician Assistants and Nurse Practitioners) who all work together to provide you with the care you need, when you need it.  We recommend signing up for the patient portal called "MyChart".  Sign up information is provided on this After Visit Summary.  MyChart is used to connect with patients for Virtual Visits (Telemedicine).  Patients are able to view lab/test results, encounter notes, upcoming appointments, etc.  Non-urgent messages can be sent to your provider as well.   To learn more about what you can do with MyChart, go to [email or web address].    Your next appointment:   1 year(s)  The format for your next appointment:   In Person  Provider:   You may see Dr. Johnsie Cancel or one of the following Advanced Practice Providers on your designated Care Team:   Cecilie Kicks, NP  You have been referred to Pharmacist for Lipid Clinic.

## 2021-03-07 ENCOUNTER — Telehealth: Payer: Self-pay

## 2021-03-07 NOTE — Telephone Encounter (Signed)
Pt calling due to ozempic causing nausea and fatigue on day 2-4. States has been happening past 8 weeks. Pt is requesting a different medications that will work the same. Please advise.   Shelby Barnes, [place] 03/07/21 10:09 AM

## 2021-03-07 NOTE — Telephone Encounter (Signed)
Recommend drop dose to 0.25 mg weekly. IF taking this dose, have pt discontinue it.  Work on diet and exercise. Follow up in in September. kc

## 2021-03-07 NOTE — Telephone Encounter (Signed)
Called pt. Pt aware.   Shelby Barnes 03/07/21 2:41 PM

## 2021-03-11 ENCOUNTER — Other Ambulatory Visit: Payer: Self-pay | Admitting: Family Medicine

## 2021-03-11 DIAGNOSIS — R7303 Prediabetes: Secondary | ICD-10-CM

## 2021-03-17 ENCOUNTER — Ambulatory Visit (INDEPENDENT_AMBULATORY_CARE_PROVIDER_SITE_OTHER): Payer: BC Managed Care – PPO | Admitting: Pharmacist

## 2021-03-17 ENCOUNTER — Other Ambulatory Visit: Payer: Self-pay

## 2021-03-17 DIAGNOSIS — R931 Abnormal findings on diagnostic imaging of heart and coronary circulation: Secondary | ICD-10-CM

## 2021-03-17 DIAGNOSIS — E785 Hyperlipidemia, unspecified: Secondary | ICD-10-CM | POA: Insufficient documentation

## 2021-03-17 DIAGNOSIS — E782 Mixed hyperlipidemia: Secondary | ICD-10-CM | POA: Diagnosis not present

## 2021-03-17 HISTORY — DX: Hyperlipidemia, unspecified: E78.5

## 2021-03-17 HISTORY — DX: Abnormal findings on diagnostic imaging of heart and coronary circulation: R93.1

## 2021-03-17 NOTE — Patient Instructions (Addendum)
It was nice to meet you today  Your LDL cholesterol is 184 at baseline and was 155 most recently  Your goal is < 70 due to your family history of heart disease and your elevated calcium score  Continue taking atorvastatin '10mg'$  daily  Stop niacin  Recheck fasting cholesterol on Monday, August 29th any time after 7:30am  Call Jinny Blossom, PharmD with any tolerability issues [phone]  Other cholesterol medications include: -Zetia (ezetimibe), daily pill - lowers LDL by 20% -Nexlizet, daily pill - lowers LDL by 40-50% -Praluent or Repatha, twice monthly injection - lowers LDL by 60%

## 2021-03-17 NOTE — Progress Notes (Addendum)
Patient ID: Shelby Barnes                 DOB: 17-May-1961                    MRN: [id number]     HPI: Shelby Barnes is a 60 y.o. female patient referred to lipid clinic by Dr Johnsie Cancel. PMH is significant for CAD, pre-DM, and family history of premature CAD. Sees [facility] and is on testosterone pellets and oral progesterone for insomnia. Not vaccinated against COVID. Calcium score done at [facility] showed total score of 24 which was 77th percentile for age and sex. Previously experienced myalgias and fatigue on rosuvastatin. Wanted to try red yeast rice and niacin. LDL improved from 184 to 155. Started atorvastatin '10mg'$  daily at last visit with Dr Johnsie Cancel 7/13.  Presents today for follow up. Has been taking atorvastatin for about 2 weeks. Reports full body aches with unknown dose of rosuvastatin, aches took about 1 month to appear. Has done her own research on statins and doesn't want to take in the long run due to side effects she has read about including liver, muscle, and cognition issues. Tolerating her atorvastatin well so far.  Current Medications: niacin '500mg'$  daily, red yeast rice '600mg'$  daily, atorvastatin '10mg'$  daily Intolerances: rosuvastatin (unknown dose) - myalgias and fatigue Risk Factors: elevated calcium score, pre-DM, fam hx of premature CAD LDL goal: '70mg'$ /dL  Family History: Father with heart failure, maternal grandmother and mother with DM, paternal grandmother with stroke, paternal grandfather with heart failure, maternal aunt with breast cancer.  Social History: Occasional alcohol use, denies drug or tobacco use.  Labs: 01/12/21: TC 231, TG 122, HDL 54, LDL 155 (red yeast rice '600mg'$  daily) 08/12/20: TC 260, TG 129, HDL 53, LDL 184 (no LLT)  Past Medical History:  Diagnosis Date   Anxiety    Binge eating    Depression    Dysmenorrhea    Fibroid    Genital HSV 1981   Hypercholesterolemia    Insomnia    Kidney stones 2001   Low back pain    Pelvic  pain    Sinusitis    STD (sexually transmitted disease) 1981   herpes    Current Outpatient Medications on File Prior to Visit  Medication Sig Dispense Refill   acyclovir (ZOVIRAX) 400 MG tablet Two tablet daily for HSV suppression 180 tablet 4   ALPRAZolam (XANAX) 1 MG tablet Take 1 mg by mouth at bedtime as needed.     aspirin 81 MG chewable tablet Chew 81 mg by mouth daily.     atorvastatin (LIPITOR) 10 MG tablet Take 1 tablet (10 mg total) by mouth daily. 30 tablet 11   cefdinir (OMNICEF) 300 MG capsule Take 1 capsule (300 mg total) by mouth 2 (two) times daily. 14 capsule 0   Cholecalciferol (D3 PO) Take 1 tablet by mouth daily.     metFORMIN (GLUCOPHAGE) 500 MG tablet TAKE 1 TABLET(500 MG) BY MOUTH DAILY WITH BREAKFAST 90 tablet 1   Multiple Vitamin (MULTIVITAMIN WITH MINERALS) TABS tablet Take 1 tablet by mouth daily.     niacin 500 MG tablet Take 500 mg by mouth at bedtime.     progesterone (PROMETRIUM) 100 MG capsule TAKE 1 TO 2 CAPSULES BY MOUTH EVERY NIGHT AT BEDTIME     Red Yeast Rice 600 MG TABS Take 600 mg by mouth in the morning and at bedtime.     Semaglutide,0.25 or  0.'5MG'$ /DOS, (OZEMPIC, 0.25 OR 0.5 MG/DOSE,) 2 MG/1.5ML SOPN Inject 0.5 mg into the skin once a week. 4.5 mL 0   UBIQUINOL LIPOSOMAL PO Take 1,600 mg by mouth daily.     UNABLE TO FIND Bio-identical testosterone pellet     Zinc Acetate, Oral, (ZINC ACETATE PO) Take 1 tablet by mouth daily.     zolpidem (AMBIEN CR) 12.5 MG CR tablet Take 12.5 mg by mouth at bedtime as needed.     No current facility-administered medications on file prior to visit.    Allergies  Allergen Reactions   Amoxicillin-Pot Clavulanate Diarrhea and Other (See Comments)   Codeine Other (See Comments)   Crestor [Rosuvastatin]     Body aches    Hydrocodone Nausea And Vomiting    Assessment/Plan:  1. Hyperlipidemia - Mild improvement in LDL from baseline of 184 to 155 after starting red yeast rice and niacin. She is intolerant to  rosuvastatin, currently tolerating atorvastatin '10mg'$  daily. Has been taking for 2 weeks and reports myalgias with rosuvastatin took a month to present. Advised her to continue on atorvastatin and will recheck labs in another month. She does not want to take statins long term because of the side effects she has read about. Discussed strong cardiac data supporting statin therapy as first line agents to prevent heart disease. Also discussed potential add on options including ezetimibe, Nexlizet, and PCSK9i therapy. Pt has preference for PCSK9i therapy. She will need to be on max tolerated statin dose first. Also advised her to stop niacin due to effects on raising glucose since she's pre-diabetic. She plans to continue red yeast rice.  Abie Killian E. Razi Hickle, PharmD, BCACP, [place] [id number], [place], [place] [address] Phone: [phone]; Fax: [phone] 03/17/2021 4:12 PM

## 2021-03-23 DIAGNOSIS — F339 Major depressive disorder, recurrent, unspecified: Secondary | ICD-10-CM | POA: Diagnosis not present

## 2021-03-23 DIAGNOSIS — F5101 Primary insomnia: Secondary | ICD-10-CM | POA: Diagnosis not present

## 2021-03-23 DIAGNOSIS — F411 Generalized anxiety disorder: Secondary | ICD-10-CM | POA: Diagnosis not present

## 2021-04-05 DIAGNOSIS — R5382 Chronic fatigue, unspecified: Secondary | ICD-10-CM | POA: Diagnosis not present

## 2021-04-05 DIAGNOSIS — N951 Menopausal and female climacteric states: Secondary | ICD-10-CM | POA: Diagnosis not present

## 2021-04-18 ENCOUNTER — Telehealth: Payer: Self-pay | Admitting: Cardiovascular Disease

## 2021-04-18 ENCOUNTER — Other Ambulatory Visit: Payer: BC Managed Care – PPO

## 2021-04-18 ENCOUNTER — Other Ambulatory Visit: Payer: Self-pay

## 2021-04-18 ENCOUNTER — Other Ambulatory Visit: Payer: Self-pay | Admitting: Family Medicine

## 2021-04-18 DIAGNOSIS — R7301 Impaired fasting glucose: Secondary | ICD-10-CM | POA: Diagnosis not present

## 2021-04-18 DIAGNOSIS — E782 Mixed hyperlipidemia: Secondary | ICD-10-CM

## 2021-04-18 NOTE — Telephone Encounter (Signed)
Pt c/o medication issue:  1. Name of Medication:  atorvastatin (LIPITOR) 10 MG tablet  2. How are you currently taking this medication (dosage and times per day)? As prescribed   3. Are you having a reaction (difficulty breathing--STAT)? Yes  4. What is your medication issue? Having body aches and nausea at times. Not as severe as it was when on crestor. States it is more so lower extremities. Requesting to speak with the pharmacist Megan in regards to this.

## 2021-04-18 NOTE — Telephone Encounter (Signed)
Called patient back. Informed patient that Jinny Blossom is not in today, but she can call her back when she is back in the office. Patient stated that Lipitor is giving her similar symptoms that she had on Crestor but not as bad. Patient stated she has aching pain in legs, bottom of feet, back and shoulders. Patient does report nausea, but she is not sure it is related. Patient stated she does not think she is interested in the injectable medications, but wanted to discuss another medication that was a pill that Jinny Blossom was telling her about. Informed patient that a message would be sent to [facility] our Pharmacist. Encourage patient to hold lipitor for now, until she hears back from our office.

## 2021-04-19 LAB — CBC WITH DIFFERENTIAL/PLATELET
Basophils Absolute: 0.1 10*3/uL (ref 0.0–0.2)
Basos: 2 %
EOS (ABSOLUTE): 0.1 10*3/uL (ref 0.0–0.4)
Eos: 3 %
Hematocrit: 40.6 % (ref 34.0–46.6)
Hemoglobin: 13.8 g/dL (ref 11.1–15.9)
Immature Grans (Abs): 0 10*3/uL (ref 0.0–0.1)
Immature Granulocytes: 1 %
Lymphocytes Absolute: 1.5 10*3/uL (ref 0.7–3.1)
Lymphs: 38 %
MCH: 32.2 pg (ref 26.6–33.0)
MCHC: 34 g/dL (ref 31.5–35.7)
MCV: 95 fL (ref 79–97)
Monocytes Absolute: 0.4 10*3/uL (ref 0.1–0.9)
Monocytes: 9 %
Neutrophils Absolute: 1.9 10*3/uL (ref 1.4–7.0)
Neutrophils: 47 %
Platelets: 264 10*3/uL (ref 150–450)
RBC: 4.28 x10E6/uL (ref 3.77–5.28)
RDW: 12.7 % (ref 11.7–15.4)
WBC: 4 10*3/uL (ref 3.4–10.8)

## 2021-04-19 LAB — COMPREHENSIVE METABOLIC PANEL
ALT: 6 IU/L (ref 0–32)
AST: 16 IU/L (ref 0–40)
Albumin/Globulin Ratio: 2 (ref 1.2–2.2)
Albumin: 4.1 g/dL (ref 3.8–4.8)
Alkaline Phosphatase: 82 IU/L (ref 44–121)
BUN/Creatinine Ratio: 22 (ref 12–28)
BUN: 19 mg/dL (ref 8–27)
Bilirubin Total: 0.4 mg/dL (ref 0.0–1.2)
CO2: 23 mmol/L (ref 20–29)
Calcium: 9.3 mg/dL (ref 8.7–10.3)
Chloride: 103 mmol/L (ref 96–106)
Creatinine, Ser: 0.88 mg/dL (ref 0.57–1.00)
Globulin, Total: 2.1 g/dL (ref 1.5–4.5)
Glucose: 111 mg/dL — ABNORMAL HIGH (ref 65–99)
Potassium: 4.5 mmol/L (ref 3.5–5.2)
Sodium: 139 mmol/L (ref 134–144)
Total Protein: 6.2 g/dL (ref 6.0–8.5)
eGFR: 75 mL/min/{1.73_m2} (ref >59)

## 2021-04-19 LAB — LIPID PANEL
Chol/HDL Ratio: 3.8 ratio (ref 0.0–4.4)
Cholesterol, Total: 200 mg/dL — ABNORMAL HIGH (ref 100–199)
HDL: 52 mg/dL (ref >39)
LDL Chol Calc (NIH): 130 mg/dL — ABNORMAL HIGH (ref 0–99)
Triglycerides: 99 mg/dL (ref 0–149)
VLDL Cholesterol Cal: 18 mg/dL (ref 5–40)

## 2021-04-19 LAB — CARDIOVASCULAR RISK ASSESSMENT

## 2021-04-19 NOTE — Telephone Encounter (Addendum)
Pt intolerant to rosuvastatin at unknown dose (myalgias and fatigue) and now atorvastatin '10mg'$  daily (muscle aches in her lower extremities, nausea).  Baseline LDL is 184, goal is < 70 given elevated calcium score of 24 (77th percentile for age and sex). LDL improved to 155 after starting red yeast rice, and improved again to 130 on yesterday's lab work which reflects pt on atorvastatin as well.  Would recommend either statin rechallenge with low dose pravastatin or Nexlizet if pt does not wish to take PCSK9i injectable therapy. Spoke with pt who wishes to try Nexlizet next. Will submit prior authorization.

## 2021-04-20 NOTE — Progress Notes (Signed)
Subjective:  Patient ID: Shelby Barnes, female    DOB: 08/28/1960  Age: 60 y.o. MRN: [id number]  Chief Complaint  Patient presents with   Hyperlipidemia   Prediabetes     HPI Insomnia- ambien cr 12.5 qhs Prediabetes- metformin 500 mg daily Cholesterol- Lipitor 10 mg Current Outpatient Medications on File Prior to Visit  Medication Sig Dispense Refill   acyclovir (ZOVIRAX) 400 MG tablet Two tablet daily for HSV suppression 180 tablet 4   ALPRAZolam (XANAX) 1 MG tablet Take 1 mg by mouth at bedtime as needed.     aspirin 81 MG chewable tablet Chew 81 mg by mouth daily.     atorvastatin (LIPITOR) 10 MG tablet Take 1 tablet (10 mg total) by mouth daily. 30 tablet 11   Cholecalciferol (D3 PO) Take 1 tablet by mouth daily.     metFORMIN (GLUCOPHAGE) 500 MG tablet TAKE 1 TABLET(500 MG) BY MOUTH DAILY WITH BREAKFAST 90 tablet 1   Multiple Vitamin (MULTIVITAMIN WITH MINERALS) TABS tablet Take 1 tablet by mouth daily.     progesterone (PROMETRIUM) 100 MG capsule TAKE 1 TO 2 CAPSULES BY MOUTH EVERY NIGHT AT BEDTIME     Red Yeast Rice 600 MG TABS Take 600 mg by mouth in the morning and at bedtime.     UBIQUINOL LIPOSOMAL PO Take 1,600 mg by mouth daily.     UNABLE TO FIND Bio-identical testosterone pellet     Zinc Acetate, Oral, (ZINC ACETATE PO) Take 1 tablet by mouth daily.     zolpidem (AMBIEN CR) 12.5 MG CR tablet Take 12.5 mg by mouth at bedtime as needed.     No current facility-administered medications on file prior to visit.   Past Medical History:  Diagnosis Date   Anxiety    Binge eating    Depression    Dysmenorrhea    Fibroid    Genital HSV 1981   Hypercholesterolemia    Insomnia    Kidney stones 2001   Low back pain    Pelvic pain    Sinusitis    STD (sexually transmitted disease) 1981   herpes   Past Surgical History:  Procedure Laterality Date   BREAST EXCISIONAL BIOPSY Left 1998   Fibroadenoma   BREAST SURGERY Left 09/1996   fibroadenema   COLONOSCOPY   05/2011    Family History  Problem Relation Age of Onset   Diabetes Mother 41       Whipple Procedure from cyst on pancrease   Heart failure Father    Diabetes Maternal Grandmother    Stroke Paternal Grandmother    Heart failure Paternal Grandfather    Breast cancer Maternal Aunt 70       with metasis   Social History   Socioeconomic History   Marital status: Divorced    Spouse name: Not on file   Number of children: 0   Years of education: Not on file   Highest education level: Not on file  Occupational History   Occupation: Office manager  Tobacco Use   Smoking status: Never   Smokeless tobacco: Never  Substance and Sexual Activity   Alcohol use: Yes    Alcohol/week: 1.0 standard drink    Types: 1 Standard drinks or equivalent per week    Comment: 1 a week   Drug use: No   Sexual activity: Not Currently    Partners: Male    Birth control/protection: Post-menopausal  Other Topics Concern   Not on file  Social History  Narrative   Not on file   Social Determinants of Health   Financial Resource Strain: Not on file  Food Insecurity: Not on file  Transportation Needs: Not on file  Physical Activity: Not on file  Stress: Not on file  Social Connections: Not on file    Review of Systems  Constitutional:  Negative for chills, fatigue and fever.  HENT:  Negative for congestion, ear pain, rhinorrhea and sore throat.   Respiratory:  Negative for cough and shortness of breath.   Cardiovascular:  Negative for chest pain.  Gastrointestinal:  Negative for abdominal pain, constipation, diarrhea, nausea and vomiting.  Genitourinary:  Negative for dysuria and urgency.  Musculoskeletal:  Negative for back pain and myalgias.  Neurological:  Negative for dizziness, weakness, light-headedness and headaches.  Psychiatric/Behavioral:  Negative for dysphoric mood. The patient is not nervous/anxious.     Objective:  LMP 07/21/2014   BP/Weight 03/02/2021 01/14/2021  [id number]  Systolic BP 0000000 0000000 0000000  Diastolic BP 74 72 76  Wt. (Lbs) 181.2 183 188  BMI 31.1 31.41 32.27    Physical Exam Vitals reviewed.  Constitutional:      Appearance: Normal appearance. She is normal weight.  Neck:     Vascular: No carotid bruit.  Cardiovascular:     Rate and Rhythm: Normal rate and regular rhythm.     Pulses: Normal pulses.     Heart sounds: Normal heart sounds.  Pulmonary:     Effort: Pulmonary effort is normal. No respiratory distress.     Breath sounds: Normal breath sounds.  Abdominal:     General: Abdomen is flat. Bowel sounds are normal.     Palpations: Abdomen is soft.     Tenderness: There is no abdominal tenderness.  Neurological:     Mental Status: She is alert and oriented to person, place, and time.  Psychiatric:        Mood and Affect: Mood normal.        Behavior: Behavior normal.    Diabetic Foot Exam - Simple   No data filed      Lab Results  Component Value Date   WBC 4.0 04/18/2021   HGB 13.8 04/18/2021   HCT 40.6 04/18/2021   PLT 264 04/18/2021   GLUCOSE 111 (H) 04/18/2021   CHOL 200 (H) 04/18/2021   TRIG 99 04/18/2021   HDL 52 04/18/2021   LDLCALC 130 (H) 04/18/2021   ALT 6 04/18/2021   AST 16 04/18/2021   NA 139 04/18/2021   K 4.5 04/18/2021   CL 103 04/18/2021   CREATININE 0.88 04/18/2021   BUN 19 04/18/2021   CO2 23 04/18/2021   TSH 1.020 01/12/2021   HGBA1C 5.8 (H) 01/12/2021      Assessment & Plan:   1. Mixed hyperlipidemia Recommend continue to work on eating healthy diet and exercise.  - Ambulatory referral to Cardiology  2. Prediabetes The current medical regimen is effective;  continue present plan and medications.  3. Primary insomnia  4. Family history of heart disease - Ambulatory referral to Cardiology    No orders of the defined types were placed in this encounter.   No orders of the defined types were placed in this encounter.    Follow-up: No follow-ups on file.  An After  Visit Summary was printed and given to the patient.  Rochel Brome, MD [facility] [phone]

## 2021-04-21 ENCOUNTER — Ambulatory Visit: Payer: BC Managed Care – PPO | Admitting: Family Medicine

## 2021-04-21 ENCOUNTER — Other Ambulatory Visit: Payer: Self-pay

## 2021-04-21 ENCOUNTER — Telehealth: Payer: Self-pay | Admitting: Cardiovascular Disease

## 2021-04-21 ENCOUNTER — Encounter: Payer: Self-pay | Admitting: Family Medicine

## 2021-04-21 VITALS — BP 106/70 | HR 72 | Temp 97.7°F | Resp 16 | Ht 64.0 in | Wt 186.0 lb

## 2021-04-21 DIAGNOSIS — R7303 Prediabetes: Secondary | ICD-10-CM

## 2021-04-21 DIAGNOSIS — F5101 Primary insomnia: Secondary | ICD-10-CM

## 2021-04-21 DIAGNOSIS — E782 Mixed hyperlipidemia: Secondary | ICD-10-CM

## 2021-04-21 DIAGNOSIS — Z8249 Family history of ischemic heart disease and other diseases of the circulatory system: Secondary | ICD-10-CM | POA: Diagnosis not present

## 2021-04-21 DIAGNOSIS — Z6831 Body mass index (BMI) 31.0-31.9, adult: Secondary | ICD-10-CM

## 2021-04-21 DIAGNOSIS — E6609 Other obesity due to excess calories: Secondary | ICD-10-CM

## 2021-04-21 LAB — SPECIMEN STATUS REPORT

## 2021-04-21 LAB — HGB A1C W/O EAG: Hgb A1c MFr Bld: 5.5 % (ref 4.8–5.6)

## 2021-04-21 MED ORDER — EZETIMIBE 10 MG PO TABS: 10.0000 mg | ORAL_TABLET | Freq: Every day | ORAL | 0 refills | Status: DC

## 2021-04-21 MED ORDER — SAXENDA 18 MG/3ML ~~LOC~~ SOPN: 3.0000 mg | PEN_INJECTOR | Freq: Every day | SUBCUTANEOUS | 0 refills | Status: DC

## 2021-04-21 NOTE — Patient Instructions (Addendum)
Zetia 10 mg daily.  Start on saxenda.  Recommend continue to work on eating healthy diet and exercise.

## 2021-04-21 NOTE — Progress Notes (Signed)
Subjective:  Patient ID: Shelby Barnes, female    DOB: September 13, 1960  Age: 60 y.o. MRN: [id number]  Chief Complaint  Patient presents with   Hyperlipidemia   Prediabetes    HPI PreDiabetes:  Most recent A1C: 5.8 Current medications: tried ozempic, but caused her severe nausea.  Started on metformin in July by Dr. Henrene Pastor.  Currently taking Lifestyle changes: Eats healthy. Exercises.   Hyperlipidemia: Current medications: intolerant to statin. Tried both lipitor and crestor and both caused muscle pain.  Referred to plan alternative cholesterol medication. Patient is seeing Dr. Johnsie Cancel.  Current Outpatient Medications on File Prior to Visit  Medication Sig Dispense Refill   acyclovir (ZOVIRAX) 400 MG tablet Two tablet daily for HSV suppression 180 tablet 4   ALPRAZolam (XANAX) 1 MG tablet Take 1 mg by mouth at bedtime as needed.     aspirin 81 MG chewable tablet Chew 81 mg by mouth daily.     atorvastatin (LIPITOR) 10 MG tablet Take 10 mg by mouth every other day.     Cholecalciferol (D3 PO) Take 1 tablet by mouth daily.     metFORMIN (GLUCOPHAGE) 500 MG tablet TAKE 1 TABLET(500 MG) BY MOUTH DAILY WITH BREAKFAST 90 tablet 1   Multiple Vitamin (MULTIVITAMIN WITH MINERALS) TABS tablet Take 1 tablet by mouth daily.     progesterone (PROMETRIUM) 100 MG capsule TAKE 1 TO 2 CAPSULES BY MOUTH EVERY NIGHT AT BEDTIME     Red Yeast Rice 600 MG TABS Take 600 mg by mouth in the morning and at bedtime.     UBIQUINOL LIPOSOMAL PO Take 1,600 mg by mouth daily.     UNABLE TO FIND Bio-identical testosterone pellet     Zinc Acetate, Oral, (ZINC ACETATE PO) Take 1 tablet by mouth daily.     zolpidem (AMBIEN CR) 12.5 MG CR tablet Take 12.5 mg by mouth at bedtime as needed.     No current facility-administered medications on file prior to visit.   Past Medical History:  Diagnosis Date   Anxiety    Binge eating    Depression    Dysmenorrhea    Fibroid    Genital HSV 1981   Hypercholesterolemia     Insomnia    Kidney stones 2001   Low back pain    Pelvic pain    Sinusitis    STD (sexually transmitted disease) 1981   herpes   Past Surgical History:  Procedure Laterality Date   BREAST EXCISIONAL BIOPSY Left 1998   Fibroadenoma   BREAST SURGERY Left 09/1996   fibroadenema   COLONOSCOPY  05/2011    Family History  Problem Relation Age of Onset   Diabetes Mother 43       Whipple Procedure from cyst on pancrease   Heart failure Father    Diabetes Maternal Grandmother    Stroke Paternal Grandmother    Heart failure Paternal Grandfather    Breast cancer Maternal Aunt 70       with metasis   Social History   Socioeconomic History   Marital status: Divorced    Spouse name: Not on file   Number of children: 0   Years of education: Not on file   Highest education level: Not on file  Occupational History   Occupation: Office manager  Tobacco Use   Smoking status: Never   Smokeless tobacco: Never  Substance and Sexual Activity   Alcohol use: Yes    Alcohol/week: 1.0 standard drink    Types: 1 Standard  drinks or equivalent per week    Comment: 1 a week   Drug use: No   Sexual activity: Not Currently    Partners: Male    Birth control/protection: Post-menopausal  Other Topics Concern   Not on file  Social History Narrative   Not on file   Social Determinants of Health   Financial Resource Strain: Not on file  Food Insecurity: Not on file  Transportation Needs: Not on file  Physical Activity: Not on file  Stress: Not on file  Social Connections: Not on file    Review of Systems  Constitutional:  Positive for fatigue. Negative for chills and fever.  HENT:  Negative for congestion, rhinorrhea and sore throat.   Respiratory:  Negative for cough and shortness of breath.   Cardiovascular:  Negative for chest pain.  Gastrointestinal:  Negative for abdominal pain, constipation, diarrhea, nausea and vomiting.  Genitourinary:  Negative for dysuria and  urgency.  Musculoskeletal:  Negative for back pain and myalgias.  Neurological:  Negative for dizziness, weakness, light-headedness and headaches.  Psychiatric/Behavioral:  Negative for dysphoric mood. The patient is not nervous/anxious.     Objective:  BP 106/70   Pulse 72   Temp 97.7 F (36.5 C)   Resp 16   Ht '5\' 4"'$  (1.626 m)   Wt 186 lb (84.4 kg)   LMP 07/21/2014   BMI 31.93 kg/m   BP/Weight 04/21/2021 03/02/2021 99991111  Systolic BP [id number] [id number] 0000000  Diastolic BP 70 74 72  Wt. (Lbs) 186 181.2 183  BMI 31.93 31.1 31.41    Physical Exam Vitals reviewed.  Constitutional:      Appearance: Normal appearance. She is obese.  Neck:     Vascular: No carotid bruit.  Cardiovascular:     Rate and Rhythm: Normal rate and regular rhythm.     Heart sounds: Normal heart sounds.  Pulmonary:     Effort: Pulmonary effort is normal. No respiratory distress.     Breath sounds: Normal breath sounds.  Abdominal:     General: Abdomen is flat. Bowel sounds are normal.     Palpations: Abdomen is soft.     Tenderness: There is no abdominal tenderness.  Neurological:     Mental Status: She is alert and oriented to person, place, and time.  Psychiatric:        Mood and Affect: Mood normal.        Behavior: Behavior normal.    Diabetic Foot Exam - Simple   No data filed      Lab Results  Component Value Date   WBC 4.0 04/18/2021   HGB 13.8 04/18/2021   HCT 40.6 04/18/2021   PLT 264 04/18/2021   GLUCOSE 111 (H) 04/18/2021   CHOL 200 (H) 04/18/2021   TRIG 99 04/18/2021   HDL 52 04/18/2021   LDLCALC 130 (H) 04/18/2021   ALT 6 04/18/2021   AST 16 04/18/2021   NA 139 04/18/2021   K 4.5 04/18/2021   CL 103 04/18/2021   CREATININE 0.88 04/18/2021   BUN 19 04/18/2021   CO2 23 04/18/2021   TSH 1.020 01/12/2021   HGBA1C 5.5 04/18/2021      Assessment & Plan:   1. Mixed hyperlipidemia Recommend patient start on Zetia 10 mg once daily. Strong family history of cardiac  disease.\ Patient would like to switch to cardiologist in [place] - Ambulatory referral to Cardiology  2. Prediabetes Recommend low-carb/low sugar diet.  3. Primary insomnia Well-controlled on Ambien CR 12.5 mg  daily at night  4. Family history of heart disease - Ambulatory referral to Cardiology  5. Class 1 obesity due to excess calories with serious comorbidity and body mass index (BMI) of 31.0 to 31.9 in adult Recommend Saxenda titration.  Meds ordered this encounter  Medications   Liraglutide -Weight Management (SAXENDA) 18 MG/3ML SOPN    Sig: Inject 3 mg into the skin daily.    Dispense:  3 mL    Refill:  0   DISCONTD: ezetimibe (ZETIA) 10 MG tablet    Sig: Take 1 tablet (10 mg total) by mouth daily.    Dispense:  90 tablet    Refill:  0    Orders Placed This Encounter  Procedures   Ambulatory referral to Cardiology     Follow-up: Return in about 4 weeks (around 05/19/2021) for not fasting.  An After Visit Summary was printed and given to the patient.  Rochel Brome, MD [facility] [phone]

## 2021-04-21 NOTE — Telephone Encounter (Signed)
Pt would like to switch providers due to location, please send permission for this transfer of care from [facility] to [place].  Thank you both!  Ammie Dalton

## 2021-04-22 NOTE — Telephone Encounter (Signed)
Nexlizet PA was denied, they will cover for FH or ASCVD but they do not count elevated calcium score as ASCVD unless pt has had revascularization. She was advised by PCP yesterday to start ezetimibe '10mg'$  daily and try taking her atorvastatin a few days a week, I am in agreement with this plan. Pt also states she will be seeing a cardiologist in [place] instead, will f/u as needed.

## 2021-04-24 ENCOUNTER — Other Ambulatory Visit: Payer: Self-pay | Admitting: Family Medicine

## 2021-04-24 MED ORDER — EZETIMIBE 10 MG PO TABS: 10.0000 mg | ORAL_TABLET | Freq: Every day | ORAL | 0 refills | Status: DC

## 2021-04-26 DIAGNOSIS — N951 Menopausal and female climacteric states: Secondary | ICD-10-CM | POA: Diagnosis not present

## 2021-04-26 DIAGNOSIS — R7303 Prediabetes: Secondary | ICD-10-CM | POA: Diagnosis not present

## 2021-04-26 DIAGNOSIS — R232 Flushing: Secondary | ICD-10-CM | POA: Diagnosis not present

## 2021-04-26 DIAGNOSIS — R5382 Chronic fatigue, unspecified: Secondary | ICD-10-CM | POA: Diagnosis not present

## 2021-05-06 NOTE — Telephone Encounter (Signed)
Pt was just seen in office by Johnsie Cancel so I switched recall from [facility] to [facility], and called to let the patient know we would call her June of next year to schedule an appt for July.  kbl

## 2021-05-19 ENCOUNTER — Other Ambulatory Visit: Payer: Self-pay

## 2021-05-19 ENCOUNTER — Ambulatory Visit: Payer: BC Managed Care – PPO | Admitting: Family Medicine

## 2021-05-19 VITALS — BP 110/70 | HR 76 | Temp 96.1°F | Resp 16 | Ht 64.0 in | Wt 187.0 lb

## 2021-05-19 DIAGNOSIS — E782 Mixed hyperlipidemia: Secondary | ICD-10-CM | POA: Diagnosis not present

## 2021-05-19 DIAGNOSIS — Z6831 Body mass index (BMI) 31.0-31.9, adult: Secondary | ICD-10-CM

## 2021-05-19 DIAGNOSIS — R7303 Prediabetes: Secondary | ICD-10-CM

## 2021-05-19 DIAGNOSIS — E6609 Other obesity due to excess calories: Secondary | ICD-10-CM | POA: Diagnosis not present

## 2021-05-19 MED ORDER — OLOPATADINE HCL 0.1 % OP SOLN: 1.0000 [drp] | Freq: Two times a day (BID) | OPHTHALMIC | 12 refills | Status: DC

## 2021-05-19 MED ORDER — OZEMPIC (0.25 OR 0.5 MG/DOSE) 2 MG/1.5ML ~~LOC~~ SOPN: 0.2500 mg | PEN_INJECTOR | SUBCUTANEOUS | 2 refills | Status: DC

## 2021-05-19 MED ORDER — OZEMPIC (0.25 OR 0.5 MG/DOSE) 2 MG/1.5ML ~~LOC~~ SOPN: 0.2500 mg | PEN_INJECTOR | SUBCUTANEOUS | 0 refills | Status: DC

## 2021-05-19 NOTE — Patient Instructions (Signed)
[facility] [place]. [place],  [place]  [address] Main: [phone] Fax: [phone]  Continue ozempic 0.25 mg once every 2 weeks.

## 2021-05-19 NOTE — Progress Notes (Signed)
Subjective:  Patient ID: Shelby Barnes, female    DOB: 1960/09/02  Age: 60 y.o. MRN: [id number]  Chief Complaint  Patient presents with   Hyperlipidemia    HPI Hyperlipidemia: Added zetia qd to lipitor twice weekly, but developed BL foot pain (bottom) and her calves.   Wt Mgmt: Previously ozempic made nauseated for 2-3 days after shot, so changed to [place]. Unfortunately, this too caused even more nausea. Pt returned to ozempic 0.25 mg once every 2 weeks. Tolerates ozempic like this. It does suppress appetite. Eating healthy. Not exercising as much, but is working in yard over the weekend.  Current Outpatient Medications on File Prior to Visit  Medication Sig Dispense Refill   acyclovir (ZOVIRAX) 400 MG tablet Two tablet daily for HSV suppression 180 tablet 4   ALPRAZolam (XANAX) 1 MG tablet Take 1 mg by mouth at bedtime as needed.     aspirin 81 MG chewable tablet Chew 81 mg by mouth daily.     atorvastatin (LIPITOR) 10 MG tablet Take 10 mg by mouth every other day.     Cholecalciferol (D3 PO) Take 1 tablet by mouth daily.     metFORMIN (GLUCOPHAGE) 500 MG tablet TAKE 1 TABLET(500 MG) BY MOUTH DAILY WITH BREAKFAST 90 tablet 1   Multiple Vitamin (MULTIVITAMIN WITH MINERALS) TABS tablet Take 1 tablet by mouth daily.     progesterone (PROMETRIUM) 100 MG capsule TAKE 1 TO 2 CAPSULES BY MOUTH EVERY NIGHT AT BEDTIME     UBIQUINOL LIPOSOMAL PO Take 1,600 mg by mouth daily.     UNABLE TO FIND Bio-identical testosterone pellet     Zinc Acetate, Oral, (ZINC ACETATE PO) Take 1 tablet by mouth daily.     zolpidem (AMBIEN CR) 12.5 MG CR tablet Take 12.5 mg by mouth at bedtime as needed.     No current facility-administered medications on file prior to visit.   Past Medical History:  Diagnosis Date   Anxiety    Binge eating    Depression    Dysmenorrhea    Fibroid    Genital HSV 1981   Hypercholesterolemia    Insomnia    Kidney stones 2001   Low back pain    Pelvic pain     Sinusitis    STD (sexually transmitted disease) 1981   herpes   Subserous leiomyoma of uterus 06/14/2014   Past Surgical History:  Procedure Laterality Date   BREAST EXCISIONAL BIOPSY Left 1998   Fibroadenoma   BREAST SURGERY Left 09/1996   fibroadenema   COLONOSCOPY  05/2011    Family History  Problem Relation Age of Onset   Diabetes Mother 33       Whipple Procedure from cyst on pancrease   Heart failure Father    Diabetes Maternal Grandmother    Stroke Paternal Grandmother    Heart failure Paternal Grandfather    Breast cancer Maternal Aunt 70       with metasis   Social History   Socioeconomic History   Marital status: Divorced    Spouse name: Not on file   Number of children: 0   Years of education: Not on file   Highest education level: Not on file  Occupational History   Occupation: Office manager  Tobacco Use   Smoking status: Never   Smokeless tobacco: Never  Substance and Sexual Activity   Alcohol use: Yes    Alcohol/week: 1.0 standard drink    Types: 1 Standard drinks or equivalent per week  Comment: 1 a week   Drug use: No   Sexual activity: Not Currently    Partners: Male    Birth control/protection: Post-menopausal  Other Topics Concern   Not on file  Social History Narrative   Not on file   Social Determinants of Health   Financial Resource Strain: Not on file  Food Insecurity: Not on file  Transportation Needs: Not on file  Physical Activity: Not on file  Stress: Not on file  Social Connections: Not on file    Review of Systems  Constitutional:  Negative for chills, fatigue and fever.  HENT:  Negative for congestion, rhinorrhea and sore throat.   Eyes:  Positive for itching.  Respiratory:  Negative for cough and shortness of breath.   Cardiovascular:  Negative for chest pain.  Gastrointestinal:  Positive for nausea. Negative for abdominal pain, constipation, diarrhea and vomiting.  Genitourinary:  Negative for dysuria and  urgency.  Musculoskeletal:  Positive for myalgias. Negative for back pain.  Neurological:  Negative for dizziness, weakness, light-headedness and headaches.  Psychiatric/Behavioral:  Positive for dysphoric mood. The patient is not nervous/anxious.     Objective:  BP 110/70   Pulse 76   Temp (!) 96.1 F (35.6 C)   Resp 16   Ht 5\' 4"  (1.626 m)   Wt 187 lb (84.8 kg)   LMP 07/21/2014   BMI 32.10 kg/m   BP/Weight 05/19/2021 04/21/2021 6/81/2751  Systolic BP 700 174 944  Diastolic BP 70 70 74  Wt. (Lbs) 187 186 181.2  BMI 32.1 31.93 31.1    Physical Exam Vitals reviewed.  Constitutional:      Appearance: Normal appearance. She is normal weight.  Cardiovascular:     Rate and Rhythm: Normal rate and regular rhythm.     Heart sounds: Normal heart sounds.  Pulmonary:     Effort: Pulmonary effort is normal. No respiratory distress.     Breath sounds: Normal breath sounds.  Neurological:     Mental Status: She is alert and oriented to person, place, and time.  Psychiatric:        Mood and Affect: Mood normal.        Behavior: Behavior normal.    Diabetic Foot Exam - Simple   No data filed      Lab Results  Component Value Date   WBC 4.0 04/18/2021   HGB 13.8 04/18/2021   HCT 40.6 04/18/2021   PLT 264 04/18/2021   GLUCOSE 111 (H) 04/18/2021   CHOL 200 (H) 04/18/2021   TRIG 99 04/18/2021   HDL 52 04/18/2021   LDLCALC 130 (H) 04/18/2021   ALT 6 04/18/2021   AST 16 04/18/2021   NA 139 04/18/2021   K 4.5 04/18/2021   CL 103 04/18/2021   CREATININE 0.88 04/18/2021   BUN 19 04/18/2021   CO2 23 04/18/2021   TSH 1.020 01/12/2021   HGBA1C 5.5 04/18/2021      Assessment & Plan:   Problem List Items Addressed This Visit       Other   Hyperlipidemia    Pt stopped zetia. Continue lipitor 10 mg every other day.  Recommend continue to work on eating healthy diet and exercise.       Prediabetes - Primary    [facility]. [address]. [place],  [place]  [address] Main: [phone] Fax: [phone]  Continue ozempic 0.25 mg once every 2 weeks.      Relevant Medications   Semaglutide,0.25 or  0.5MG /DOS, (OZEMPIC, 0.25 OR 0.5 MG/DOSE,) 2 MG/1.5ML SOPN   Class 1 obesity due to excess calories with serious comorbidity and body mass index (BMI) of 31.0 to 31.9 in adult    [facility]. [address]. [place],  [place]  [address] Main: [phone] Fax: [phone]  Continue ozempic 0.25 mg once every 2 weeks.      Relevant Medications   Semaglutide,0.25 or 0.5MG /DOS, (OZEMPIC, 0.25 OR 0.5 MG/DOSE,) 2 MG/1.5ML SOPN  .  Meds ordered this encounter  Medications   DISCONTD: Semaglutide,0.25 or 0.5MG /DOS, (OZEMPIC, 0.25 OR 0.5 MG/DOSE,) 2 MG/1.5ML SOPN    Sig: Inject 0.25 mg into the skin once a week.    Dispense:  1.5 mL    Refill:  2   olopatadine (PATANOL) 0.1 % ophthalmic solution    Sig: Place 1 drop into both eyes 2 (two) times daily.    Dispense:  5 mL    Refill:  12   Semaglutide,0.25 or 0.5MG /DOS, (OZEMPIC, 0.25 OR 0.5 MG/DOSE,) 2 MG/1.5ML SOPN    Sig: Inject 0.25 mg into the skin once a week.    Dispense:  6 mL    Refill:  0    No orders of the defined types were placed in this encounter.    Follow-up: No follow-ups on file.  An After Visit Summary was printed and given to the patient.  Rochel Brome, MD [facility] [phone]

## 2021-05-24 DIAGNOSIS — L570 Actinic keratosis: Secondary | ICD-10-CM | POA: Diagnosis not present

## 2021-05-24 DIAGNOSIS — L820 Inflamed seborrheic keratosis: Secondary | ICD-10-CM | POA: Diagnosis not present

## 2021-05-24 DIAGNOSIS — D225 Melanocytic nevi of trunk: Secondary | ICD-10-CM | POA: Diagnosis not present

## 2021-05-24 DIAGNOSIS — L821 Other seborrheic keratosis: Secondary | ICD-10-CM | POA: Diagnosis not present

## 2021-05-24 DIAGNOSIS — L538 Other specified erythematous conditions: Secondary | ICD-10-CM | POA: Diagnosis not present

## 2021-05-24 DIAGNOSIS — L578 Other skin changes due to chronic exposure to nonionizing radiation: Secondary | ICD-10-CM | POA: Diagnosis not present

## 2021-05-24 DIAGNOSIS — L298 Other pruritus: Secondary | ICD-10-CM | POA: Diagnosis not present

## 2021-05-24 DIAGNOSIS — L814 Other melanin hyperpigmentation: Secondary | ICD-10-CM | POA: Diagnosis not present

## 2021-05-24 DIAGNOSIS — R208 Other disturbances of skin sensation: Secondary | ICD-10-CM | POA: Diagnosis not present

## 2021-05-31 ENCOUNTER — Encounter: Payer: Self-pay | Admitting: Family Medicine

## 2021-05-31 DIAGNOSIS — E6609 Other obesity due to excess calories: Secondary | ICD-10-CM | POA: Insufficient documentation

## 2021-05-31 DIAGNOSIS — R7303 Prediabetes: Secondary | ICD-10-CM

## 2021-05-31 DIAGNOSIS — Z6833 Body mass index (BMI) 33.0-33.9, adult: Secondary | ICD-10-CM | POA: Insufficient documentation

## 2021-05-31 DIAGNOSIS — E66811 Obesity, class 1: Secondary | ICD-10-CM

## 2021-05-31 HISTORY — DX: Other obesity due to excess calories: E66.09

## 2021-05-31 HISTORY — DX: Prediabetes: R73.03

## 2021-05-31 HISTORY — DX: Obesity, class 1: E66.811

## 2021-05-31 NOTE — Assessment & Plan Note (Signed)
[facility] [place]. [place],  [place]  [address] Main: [phone] Fax: [phone]  Continue ozempic 0.25 mg once every 2 weeks.

## 2021-05-31 NOTE — Assessment & Plan Note (Signed)
Pt stopped zetia. Continue lipitor 10 mg every other day.  Recommend continue to work on eating healthy diet and exercise.

## 2021-05-31 NOTE — Assessment & Plan Note (Signed)
[facility] [place]. [place],  [place]  [address] Main: [phone] Fax: [phone]  Continue ozempic 0.25 mg once every 2 weeks.

## 2021-06-22 NOTE — Progress Notes (Signed)
60 y.o. G0P0000 Divorced Caucasian female here for annual exam.    Denies vaginal bleeding.   Using testosterone pellets and oral progesterone. Treatment through [facility] for sleep issues. States she benefits are not as good as in the past.   Hx seasonal and situational depression, and does not like medication for this.  Deals with side effects. Denies suicidal ideation.   Takes Acyclovir for HSV prophylaxis.   PCP:  Dirk Dress, MD   Patient's last menstrual period was 07/21/2014.           Sexually active: No.  The current method of family planning is post menopausal status.    Exercising: No.  The patient does not participate in regular exercise at present. Smoker:  no  Health Maintenance: Pap:  04-14-15 Neg:Neg HR HPV, 05-28-18 Neg:Neg HR HPV History of abnormal Pap:  no MMG:  07-28-20  3D/Neg/BiRads1 Colonoscopy:  2012;next 10 years--knows she needs to schedule BMD: 2016  Result :Osteopenia per patient TDaP:  PCP Gardasil:   no HIV: Neg in the past Hep C:2017 Neg Screening Labs:  PCP. Flu vaccine and Covid:  declined.    reports that she has never smoked. She has never used smokeless tobacco. She reports current alcohol use of about 1.0 standard drink per week. She reports that she does not use drugs.  Past Medical History:  Diagnosis Date   Anxiety    Binge eating    Depression    Dysmenorrhea    Fibroid    Genital HSV 1981   Hypercholesterolemia    Insomnia    Kidney stones 2001   Low back pain    Pelvic pain    Sinusitis    STD (sexually transmitted disease) 1981   herpes   Subserous leiomyoma of uterus 06/14/2014    Past Surgical History:  Procedure Laterality Date   BREAST EXCISIONAL BIOPSY Left 1998   Fibroadenoma   BREAST SURGERY Left 09/1996   fibroadenema   COLONOSCOPY  05/2011    Current Outpatient Medications  Medication Sig Dispense Refill   acyclovir (ZOVIRAX) 400 MG tablet Two tablet daily for HSV suppression 180 tablet 4    ALPRAZolam (XANAX) 1 MG tablet Take 1 mg by mouth at bedtime as needed.     aspirin 81 MG chewable tablet Chew 81 mg by mouth daily.     Cholecalciferol (D3 PO) Take 1 tablet by mouth daily.     olopatadine (PATANOL) 0.1 % ophthalmic solution Place 1 drop into both eyes 2 (two) times daily. 5 mL 12   progesterone (PROMETRIUM) 100 MG capsule TAKE 1 TO 2 CAPSULES BY MOUTH EVERY NIGHT AT BEDTIME     UBIQUINOL LIPOSOMAL PO Take 1,600 mg by mouth daily.     UNABLE TO FIND Bio-identical testosterone pellet     Zinc Acetate, Oral, (ZINC ACETATE PO) Take 1 tablet by mouth daily.     zolpidem (AMBIEN CR) 12.5 MG CR tablet Take 12.5 mg by mouth at bedtime as needed.     No current facility-administered medications for this visit.    Family History  Problem Relation Age of Onset   Diabetes Mother 81       Whipple Procedure from cyst on pancrease   Heart failure Father    Diabetes Maternal Grandmother    Stroke Paternal Grandmother    Heart failure Paternal Grandfather    Breast cancer Maternal Aunt 70       with metasis    Review of Systems  All  other systems reviewed and are negative.  Exam:   BP 118/68   Pulse 88   Resp 16   Ht 5\' 3"  (1.6 m)   Wt 188 lb (85.3 kg)   LMP 07/21/2014   BMI 33.30 kg/m     General appearance: alert, cooperative and appears stated age Head: normocephalic, without obvious abnormality, atraumatic Neck: no adenopathy, supple, symmetrical, trachea midline and thyroid normal to inspection and palpation Lungs: clear to auscultation bilaterally Breasts: normal appearance, no masses or tenderness, No nipple retraction or dimpling, No nipple discharge or bleeding, No axillary adenopathy Heart: regular rate and rhythm Abdomen: soft, non-tender; no masses, no organomegaly Extremities: extremities normal, atraumatic, no cyanosis or edema Skin: skin color, texture, turgor normal. No rashes or lesions Lymph nodes: cervical, supraclavicular, and axillary nodes  normal. Neurologic: grossly normal  Pelvic: External genitalia:  no lesions              No abnormal inguinal nodes palpated.              Urethra:  normal appearing urethra with no masses, tenderness or lesions              Bartholins and Skenes: normal                 Vagina: normal appearing vagina with normal color and discharge, no lesions              Cervix: no lesions              Pap taken: no Bimanual Exam:  Uterus:  normal size, contour, position, consistency, mobility, non-tender              Adnexa: no mass, fullness, tenderness              Rectal exam: yes.  Confirms.              Anus:  normal sphincter tone, no lesions  Chaperone was present for exam:  Estill Bamberg, CMA  Assessment:   Well woman visit with gynecologic exam. HRT - testosterone and progesterone.  [place].  Hx HSV.  Osteopenia.  Plan: Mammogram screening discussed. Self breast awareness reviewed. Pap and HR HPV 2024.  Guidelines for Calcium, Vitamin D, regular exercise program including cardiovascular and weight bearing exercise. BMD.  Refill of Acyclovir 400 mg po bid for one year.  She knows she is due for colonoscopy.  Fu yearly and prn.   Follow up annually and prn.   After visit summary provided.

## 2021-06-23 ENCOUNTER — Encounter: Payer: Self-pay | Admitting: Obstetrics and Gynecology

## 2021-06-23 ENCOUNTER — Ambulatory Visit: Payer: Managed Care, Other (non HMO) | Admitting: Nurse Practitioner

## 2021-06-23 ENCOUNTER — Other Ambulatory Visit: Payer: Self-pay

## 2021-06-23 ENCOUNTER — Ambulatory Visit (INDEPENDENT_AMBULATORY_CARE_PROVIDER_SITE_OTHER): Payer: BC Managed Care – PPO | Admitting: Obstetrics and Gynecology

## 2021-06-23 VITALS — BP 118/68 | HR 88 | Resp 16 | Ht 63.0 in | Wt 188.0 lb

## 2021-06-23 DIAGNOSIS — Z01419 Encounter for gynecological examination (general) (routine) without abnormal findings: Secondary | ICD-10-CM | POA: Diagnosis not present

## 2021-06-23 DIAGNOSIS — Z780 Asymptomatic menopausal state: Secondary | ICD-10-CM | POA: Diagnosis not present

## 2021-06-23 DIAGNOSIS — M8580 Other specified disorders of bone density and structure, unspecified site: Secondary | ICD-10-CM

## 2021-06-23 DIAGNOSIS — Z8619 Personal history of other infectious and parasitic diseases: Secondary | ICD-10-CM

## 2021-06-23 MED ORDER — ACYCLOVIR 400 MG PO TABS: ORAL_TABLET | ORAL | 4 refills | Status: DC

## 2021-06-23 NOTE — Patient Instructions (Signed)

## 2021-06-30 ENCOUNTER — Other Ambulatory Visit: Payer: Self-pay | Admitting: Obstetrics and Gynecology

## 2021-06-30 DIAGNOSIS — Z780 Asymptomatic menopausal state: Secondary | ICD-10-CM

## 2021-06-30 DIAGNOSIS — M8580 Other specified disorders of bone density and structure, unspecified site: Secondary | ICD-10-CM

## 2021-06-30 DIAGNOSIS — Z1231 Encounter for screening mammogram for malignant neoplasm of breast: Secondary | ICD-10-CM

## 2021-07-01 DIAGNOSIS — F339 Major depressive disorder, recurrent, unspecified: Secondary | ICD-10-CM | POA: Diagnosis not present

## 2021-07-01 DIAGNOSIS — F5101 Primary insomnia: Secondary | ICD-10-CM | POA: Diagnosis not present

## 2021-07-01 DIAGNOSIS — F411 Generalized anxiety disorder: Secondary | ICD-10-CM | POA: Diagnosis not present

## 2021-07-06 DIAGNOSIS — F339 Major depressive disorder, recurrent, unspecified: Secondary | ICD-10-CM | POA: Diagnosis not present

## 2021-07-06 DIAGNOSIS — F5101 Primary insomnia: Secondary | ICD-10-CM | POA: Diagnosis not present

## 2021-07-06 DIAGNOSIS — F411 Generalized anxiety disorder: Secondary | ICD-10-CM | POA: Diagnosis not present

## 2021-07-07 ENCOUNTER — Encounter: Payer: Self-pay | Admitting: Gastroenterology

## 2021-07-13 NOTE — Progress Notes (Signed)
Cancelled.  

## 2021-07-18 ENCOUNTER — Ambulatory Visit (INDEPENDENT_AMBULATORY_CARE_PROVIDER_SITE_OTHER): Payer: BC Managed Care – PPO | Admitting: Family Medicine

## 2021-07-18 DIAGNOSIS — F5101 Primary insomnia: Secondary | ICD-10-CM

## 2021-07-18 DIAGNOSIS — E6609 Other obesity due to excess calories: Secondary | ICD-10-CM

## 2021-07-18 DIAGNOSIS — Z6831 Body mass index (BMI) 31.0-31.9, adult: Secondary | ICD-10-CM

## 2021-07-18 DIAGNOSIS — E782 Mixed hyperlipidemia: Secondary | ICD-10-CM

## 2021-07-18 DIAGNOSIS — R7303 Prediabetes: Secondary | ICD-10-CM

## 2021-07-18 DIAGNOSIS — Z8249 Family history of ischemic heart disease and other diseases of the circulatory system: Secondary | ICD-10-CM

## 2021-08-02 ENCOUNTER — Other Ambulatory Visit: Payer: Self-pay | Admitting: Family Medicine

## 2021-08-02 DIAGNOSIS — H00025 Hordeolum internum left lower eyelid: Secondary | ICD-10-CM | POA: Diagnosis not present

## 2021-08-03 ENCOUNTER — Ambulatory Visit: Payer: BC Managed Care – PPO

## 2021-09-01 ENCOUNTER — Ambulatory Visit (AMBULATORY_SURGERY_CENTER): Payer: BC Managed Care – PPO

## 2021-09-01 VITALS — Ht 64.0 in | Wt 185.0 lb

## 2021-09-01 DIAGNOSIS — T7840XA Allergy, unspecified, initial encounter: Secondary | ICD-10-CM

## 2021-09-01 DIAGNOSIS — Z1211 Encounter for screening for malignant neoplasm of colon: Secondary | ICD-10-CM

## 2021-09-01 HISTORY — DX: Allergy, unspecified, initial encounter: T78.40XA

## 2021-09-01 MED ORDER — NA SULFATE-K SULFATE-MG SULF 17.5-3.13-1.6 GM/177ML PO SOLN: 1.0000 | Freq: Once | ORAL | 0 refills | Status: AC

## 2021-09-01 NOTE — Progress Notes (Signed)
No egg or soy allergy known to patient  No issues known to pt with past sedation with any surgeries or procedures Patient denies ever being told they had issues or difficulty with intubation  No FH of Malignant Hyperthermia Pt is not on diet pills Pt is not on  home 02  Pt is not on blood thinners  Pt denies issues with constipation  No A fib or A flutter  Pt is not vaccinated  for Covid      NO PA's for preps discussed with pt In PV today  Discussed with pt there will be an out-of-pocket cost for prep and that varies from $0 to 70 +  dollars - pt verbalized understanding   Due to the COVID-19 pandemic we are asking patients to follow certain guidelines in PV and the [place]   Pt aware of COVID protocols and [facility] guidelines   PV completed over the phone. Pt verified name, DOB, address and insurance during PV today.  Pt mailed instruction packet with copy of consent form to read and not return, and instructions.  Pt encouraged to call with questions or issues.  If pt has My chart, procedure instructions sent via My Chart

## 2021-09-09 ENCOUNTER — Encounter: Payer: Self-pay | Admitting: Gastroenterology

## 2021-09-15 ENCOUNTER — Other Ambulatory Visit: Payer: Self-pay

## 2021-09-15 ENCOUNTER — Ambulatory Visit (AMBULATORY_SURGERY_CENTER): Payer: BC Managed Care – PPO | Admitting: Gastroenterology

## 2021-09-15 ENCOUNTER — Encounter: Payer: Self-pay | Admitting: Gastroenterology

## 2021-09-15 VITALS — BP 110/57 | HR 65 | Temp 98.0°F | Resp 10 | Ht 63.0 in | Wt 185.0 lb

## 2021-09-15 DIAGNOSIS — Z1211 Encounter for screening for malignant neoplasm of colon: Secondary | ICD-10-CM

## 2021-09-15 DIAGNOSIS — K635 Polyp of colon: Secondary | ICD-10-CM

## 2021-09-15 DIAGNOSIS — D120 Benign neoplasm of cecum: Secondary | ICD-10-CM

## 2021-09-15 MED ORDER — SODIUM CHLORIDE 0.9 % IV SOLN: 500.0000 mL | Freq: Once | INTRAVENOUS | Status: DC

## 2021-09-15 NOTE — Progress Notes (Signed)
Called to room to assist during endoscopic procedure.  Patient ID and intended procedure confirmed with present staff. Received instructions for my participation in the procedure from the performing physician.  

## 2021-09-15 NOTE — Patient Instructions (Signed)
Impression/Recommendations:  Polyp and hemorrhoid handouts given to patient.  Resume previous diet. Continue present medications. Await pathology results.  Repeat colonoscopy for surveillance based on pathology results.  YOU HAD AN ENDOSCOPIC PROCEDURE TODAY AT [facility]:   Refer to the procedure report that was given to you for any specific questions about what was found during the examination.  If the procedure report does not answer your questions, please call your gastroenterologist to clarify.  If you requested that your care partner not be given the details of your procedure findings, then the procedure report has been included in a sealed envelope for you to review at your convenience later.  YOU SHOULD EXPECT: Some feelings of bloating in the abdomen. Passage of more gas than usual.  Walking can help get rid of the air that was put into your GI tract during the procedure and reduce the bloating. If you had a lower endoscopy (such as a colonoscopy or flexible sigmoidoscopy) you may notice spotting of blood in your stool or on the toilet paper. If you underwent a bowel prep for your procedure, you may not have a normal bowel movement for a few days.  Please Note:  You might notice some irritation and congestion in your nose or some drainage.  This is from the oxygen used during your procedure.  There is no need for concern and it should clear up in a day or so.  SYMPTOMS TO REPORT IMMEDIATELY:  Following lower endoscopy (colonoscopy or flexible sigmoidoscopy):  Excessive amounts of blood in the stool  Significant tenderness or worsening of abdominal pains  Swelling of the abdomen that is new, acute  Fever of 100F or higher  For urgent or emergent issues, a gastroenterologist can be reached at any hour by calling [phone]. Do not use MyChart messaging for urgent concerns.    DIET:  We do recommend a small meal at first, but then you may proceed to your  regular diet.  Drink plenty of fluids but you should avoid alcoholic beverages for 24 hours.  ACTIVITY:  You should plan to take it easy for the rest of today and you should NOT DRIVE or use heavy machinery until tomorrow (because of the sedation medicines used during the test).    FOLLOW UP: Our staff will call the number listed on your records 48-72 hours following your procedure to check on you and address any questions or concerns that you may have regarding the information given to you following your procedure. If we do not reach you, we will leave a message.  We will attempt to reach you two times.  During this call, we will ask if you have developed any symptoms of COVID 19. If you develop any symptoms (ie: fever, flu-like symptoms, shortness of breath, cough etc.) before then, please call [phone].  If you test positive for Covid 19 in the 2 weeks post procedure, please call and report this information to [place].    If any biopsies were taken you will be contacted by phone or by letter within the next 1-3 weeks.  Please call us at [phone] if you have not heard about the biopsies in 3 weeks.    SIGNATURES/CONFIDENTIALITY: You and/or your care partner have signed paperwork which will be entered into your electronic medical record.  These signatures attest to the fact that that the information above on your After Visit Summary has been reviewed and is understood.  Full responsibility of the confidentiality of  this discharge information lies with you and/or your care-partner.

## 2021-09-15 NOTE — Progress Notes (Signed)
VS-CW  Pt's states no medical or surgical changes since previsit or office visit.  

## 2021-09-15 NOTE — Op Note (Signed)
[facility] Patient Name: Shelby Barnes Procedure Date: 09/15/2021 8:53 AM MRN: [id number] Endoscopist: Jackquline Denmark , MD Age: 61 Referring MD:  Date of Birth: July 14, 1961 Gender: Female Account #: [id number] Procedure:                Colonoscopy Indications:              Screening for colorectal malignant neoplasm Medicines:                Monitored Anesthesia Care Procedure:                Pre-Anesthesia Assessment:                           - Prior to the procedure, a History and Physical                            was performed, and patient medications and                            allergies were reviewed. The patient's tolerance of                            previous anesthesia was also reviewed. The risks                            and benefits of the procedure and the sedation                            options and risks were discussed with the patient.                            All questions were answered, and informed consent                            was obtained. Prior Anticoagulants: The patient has                            taken no previous anticoagulant or antiplatelet                            agents. ASA Grade Assessment: II - A patient with                            mild systemic disease. After reviewing the risks                            and benefits, the patient was deemed in                            satisfactory condition to undergo the procedure.                           After obtaining informed consent, the colonoscope  was passed under direct vision. Throughout the                            procedure, the patient's blood pressure, pulse, and                            oxygen saturations were monitored continuously. The                            Olympus PCF-H190DL [id number]) Colonoscope was                            introduced through the anus and advanced to the the                            cecum, identified  by appendiceal orifice and                            ileocecal valve. The colonoscopy was performed                            without difficulty. The patient tolerated the                            procedure well. The quality of the bowel                            preparation was good. The ileocecal valve,                            appendiceal orifice, and rectum were photographed. Scope In: 9:01:37 AM Scope Out: 9:13:52 AM Scope Withdrawal Time: 0 hours 9 minutes 5 seconds  Total Procedure Duration: 0 hours 12 minutes 15 seconds  Findings:                 A 4 mm polyp was found in the cecum. The polyp was                            sessile. The polyp was removed with a cold biopsy                            forceps. Resection and retrieval were complete.                           Non-bleeding internal hemorrhoids were found during                            retroflexion. The hemorrhoids were small and Grade                            I (internal hemorrhoids that do not prolapse).                           The exam was otherwise without abnormality  on                            direct and retroflexion views. Complications:            No immediate complications. Estimated Blood Loss:     Estimated blood loss: none. Impression:               - One 4 mm polyp in the cecum, removed with a cold                            biopsy forceps. Resected and retrieved.                           - Non-bleeding internal hemorrhoids.                           - The examination was otherwise normal on direct                            and retroflexion views. Recommendation:           - Patient has a contact number available for                            emergencies. The signs and symptoms of potential                            delayed complications were discussed with the                            patient. Return to normal activities tomorrow.                            Written discharge  instructions were provided to the                            patient.                           - Resume previous diet.                           - Continue present medications.                           - Await pathology results.                           - Repeat colonoscopy for surveillance based on                            pathology results.                           - The findings and recommendations were discussed  with the patient. Jackquline Denmark, MD 09/15/2021 9:16:58 AM This report has been signed electronically.

## 2021-09-15 NOTE — Progress Notes (Signed)
[facility] History and Physical   Primary Care Physician:  Rochel Brome, MD   Reason for Procedure:   colorectal cancer screening  Plan:     colonoscopy     HPI: Shelby Barnes is a 61 y.o. 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 Authorizing Provider  ALPRAZolam Duanne Moron) 1 MG tablet Take 1 mg by mouth at bedtime as needed. 06/08/20  Yes [provider]  aspirin 81 MG chewable tablet Chew 81 mg by mouth daily. 09/13/20  Yes [provider]  Cholecalciferol (D3 PO) Take 1 tablet by mouth daily.   Yes [provider]  progesterone (PROMETRIUM) 100 MG capsule TAKE 1 TO 2 CAPSULES BY MOUTH EVERY NIGHT AT BEDTIME 05/26/20  Yes [provider]  UBIQUINOL LIPOSOMAL PO Take 1,600 mg by mouth daily.   Yes [provider]  UNABLE TO FIND Bio-identical testosterone pellet   Yes [provider]  Zinc Acetate, Oral, (ZINC ACETATE PO) Take 1 tablet by mouth daily.   Yes [provider]  acyclovir (ZOVIRAX) 400 MG tablet Two tablet daily for HSV suppression Patient taking differently: as needed. Two tablet daily for HSV suppression 06/23/21   Yisroel Ramming, Everardo All, MD  olopatadine (PATANOL) 0.1 % ophthalmic solution Place 1 drop into both eyes 2 (two) times daily. Patient not taking: Reported on 09/01/2021 05/19/21   CoxElnita Maxwell,  MD  zolpidem (AMBIEN CR) 12.5 MG CR tablet Take 12.5 mg by mouth at bedtime as needed. 06/08/20   [provider]    Current Outpatient Medications  Medication Sig Dispense Refill   ALPRAZolam (XANAX) 1 MG tablet Take 1 mg by mouth at bedtime as needed.     aspirin 81 MG chewable tablet Chew 81 mg by mouth daily.     Cholecalciferol (D3 PO) Take 1 tablet by mouth daily.     progesterone (PROMETRIUM) 100 MG capsule TAKE 1 TO 2 CAPSULES BY MOUTH EVERY NIGHT AT BEDTIME     UBIQUINOL LIPOSOMAL PO Take 1,600 mg by mouth daily.     UNABLE TO FIND Bio-identical testosterone pellet     Zinc Acetate, Oral, (ZINC ACETATE PO) Take 1 tablet by mouth daily.     acyclovir (ZOVIRAX) 400 MG tablet Two tablet daily for HSV suppression (Patient taking differently: as needed. Two tablet daily for HSV suppression) 180 tablet 4   olopatadine (PATANOL) 0.1 % ophthalmic solution Place 1 drop into both eyes 2 (two) times daily. (Patient not taking: Reported on 09/01/2021) 5 mL 12   zolpidem (AMBIEN CR) 12.5 MG CR tablet Take 12.5 mg by mouth at bedtime as needed.     Current Facility-Administered Medications  Medication Dose Route Frequency Provider Last Rate Last Admin  0.9 %  sodium chloride infusion  500 mL Intravenous Once Jackquline Denmark, MD        Allergies as of 09/15/2021 - Review Complete 09/15/2021  Allergen Reaction Noted   Lipitor [atorvastatin] Other (See Comments) 04/21/2021   Amoxicillin-pot clavulanate Diarrhea and Other (See Comments) 04/06/2014   Codeine Other (See Comments) 09/13/2020   Crestor [rosuvastatin]  12/17/2012   Hydrocodone Nausea And Vomiting 12/17/2012   Zetia [ezetimibe]  05/19/2021    Family History  Problem Relation Age of Onset   Diabetes Mother 35       Whipple Procedure from cyst on pancrease   Heart failure Father    Breast cancer Maternal Aunt 70       with metasis   Diabetes Maternal Grandmother    Stroke Paternal Grandmother    Heart failure Paternal  Grandfather    Colon cancer Neg Hx    Colon polyps Neg Hx    Esophageal cancer Neg Hx    Rectal cancer Neg Hx    Stomach cancer Neg Hx     Social History   Socioeconomic History   Marital status: Divorced    Spouse name: Not on file   Number of children: 0   Years of education: Not on file   Highest education level: Not on file  Occupational History   Occupation: Office manager  Tobacco Use   Smoking status: Never   Smokeless tobacco: Never  Vaping Use   Vaping Use: Never used  Substance and Sexual Activity   Alcohol use: Yes    Alcohol/week: 1.0 standard drink    Types: 1 Standard drinks or equivalent per week    Comment: 2 drinks per month   Drug use: Never   Sexual activity: Not Currently    Partners: Male    Birth control/protection: Post-menopausal  Other Topics Concern   Not on file  Social History Narrative   Not on file   Social Determinants of Health   Financial Resource Strain: Not on file  Food Insecurity: Not on file  Transportation Needs: Not on file  Physical Activity: Not on file  Stress: Not on file  Social Connections: Not on file  Intimate Partner Violence: Not on file    Review of Systems: Positive for none All other review of systems negative except as mentioned in the HPI.  Physical Exam: Vital signs in last 24 hours: @VSRANGES @   General:   Alert,  Well-developed, well-nourished, pleasant and cooperative in NAD Lungs:  Clear throughout to auscultation.   Heart:  Regular rate and rhythm; no murmurs, clicks, rubs,  or gallops. Abdomen:  Soft, nontender and nondistended. Normal bowel sounds.   Neuro/Psych:  Alert and cooperative. Normal mood and affect. A and O x 3    No significant changes were identified.  The patient continues to be an appropriate candidate for the planned procedure and anesthesia.   Carmell Austria, MD. [facility] 09/15/2021 8:54 AM@

## 2021-09-15 NOTE — Progress Notes (Signed)
PT taken to PACU. Monitors in place. VSS. Report given to RN. 

## 2021-09-19 ENCOUNTER — Telehealth: Payer: Self-pay | Admitting: *Deleted

## 2021-09-19 NOTE — Telephone Encounter (Signed)
°  Follow up Call-  Call back number 09/15/2021  Post procedure Call Back phone  # [phone]  Permission to leave phone message Yes  Some recent data might be hidden     Patient questions:  Do you have a fever, pain , or abdominal swelling? No. Pain Score  0 *  Have you tolerated food without any problems? Yes.    Have you been able to return to your normal activities? Yes.    Do you have any questions about your discharge instructions: Diet   No. Medications  No. Follow up visit  No.  Do you have questions or concerns about your Care? No.  Actions: * If pain score is 4 or above: No action needed, pain <4.   Have you developed a fever since your procedure? no  2.   Have you had an respiratory symptoms (SOB or cough) since your procedure? no  3.   Have you tested positive for COVID 19 since your procedure no  4.   Have you had any family members/close contacts diagnosed with the COVID 19 since your procedure?  no   If yes to any of these questions please route to Joylene John, RN and Joella Prince, RN

## 2021-09-21 ENCOUNTER — Ambulatory Visit
Admission: RE | Admit: 2021-09-21 | Discharge: 2021-09-21 | Disposition: A | Discharge status: Home / Self Care | Payer: BC Managed Care – PPO | Source: Ambulatory Visit | Attending: Obstetrics and Gynecology | Admitting: Obstetrics and Gynecology

## 2021-09-21 DIAGNOSIS — Z1231 Encounter for screening mammogram for malignant neoplasm of breast: Secondary | ICD-10-CM | POA: Diagnosis not present

## 2021-09-25 ENCOUNTER — Encounter: Payer: Self-pay | Admitting: Gastroenterology

## 2021-09-30 DIAGNOSIS — F339 Major depressive disorder, recurrent, unspecified: Secondary | ICD-10-CM | POA: Diagnosis not present

## 2021-09-30 DIAGNOSIS — F411 Generalized anxiety disorder: Secondary | ICD-10-CM | POA: Diagnosis not present

## 2021-09-30 DIAGNOSIS — F5101 Primary insomnia: Secondary | ICD-10-CM | POA: Diagnosis not present

## 2021-11-22 ENCOUNTER — Encounter: Payer: Self-pay | Admitting: Legal Medicine

## 2021-11-22 ENCOUNTER — Ambulatory Visit: Payer: BC Managed Care – PPO | Admitting: Legal Medicine

## 2021-11-22 ENCOUNTER — Telehealth: Payer: Self-pay

## 2021-11-22 VITALS — BP 110/70 | HR 76 | Temp 98.1°F | Resp 15 | Ht 63.0 in | Wt 189.0 lb

## 2021-11-22 DIAGNOSIS — J0100 Acute maxillary sinusitis, unspecified: Secondary | ICD-10-CM

## 2021-11-22 MED ORDER — CEFTRIAXONE SODIUM 1 G IJ SOLR
1.0000 g | Freq: Once | INTRAMUSCULAR | Status: AC
  Administered 2021-11-22: 1 g via INTRAMUSCULAR

## 2021-11-22 MED ORDER — AMOXICILLIN 875 MG PO TABS: 875.0000 mg | ORAL_TABLET | Freq: Two times a day (BID) | ORAL | 0 refills | Status: DC

## 2021-11-22 MED ORDER — AMOXICILLIN 875 MG PO TABS: 875.0000 mg | ORAL_TABLET | Freq: Two times a day (BID) | ORAL | 0 refills | Status: AC

## 2021-11-22 NOTE — Progress Notes (Signed)
? ?Acute Office Visit ? ?Subjective:  ? ? Patient ID: Shelby Barnes, female    DOB: 1960/11/09, 61 y.o.   MRN: [id number] ? ?Chief Complaint  ?Patient presents with  ? Sinusitis  ? Stye  ? ? ?ZJQ:BHALP ?Patient is in today for sinus pressure and left ear pain since  5 days ago. She denied fever, chills, cough, sore throat. ?She also noticed a Stye on left lower eyelid. It is sore. She is doing warm compress.pain left ear. ? ?Past Medical History:  ?Diagnosis Date  ? Allergy 09/01/2021  ? fall- seasonal  ? Anxiety   ? Binge eating   ? Depression   ? Dysmenorrhea   ? Fibroid   ? Genital HSV 1981  ? Hypercholesterolemia   ? Insomnia   ? Kidney stones 2001  ? Low back pain   ? Pelvic pain   ? Sinusitis   ? STD (sexually transmitted disease) 1981  ? herpes  ? 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 ? ?Past Surgical History:  ?Procedure Laterality Date  ? BREAST EXCISIONAL BIOPSY Left 1998  ? Fibroadenoma  ? BREAST SURGERY Left 09/1996  ? fibroadenema  ? COLONOSCOPY  05/2011  ? ? ?Family History  ?Problem Relation Age of Onset  ? Diabetes Mother 28  ?     Whipple Procedure from cyst on pancrease  ? Heart failure Father   ? Breast cancer Maternal Aunt 70  ?     with metasis  ? Diabetes Maternal Grandmother   ? Stroke Paternal Grandmother   ? Heart failure Paternal Grandfather   ? Colon cancer Neg Hx   ? Colon polyps Neg Hx   ? Esophageal cancer Neg Hx   ? Rectal cancer Neg Hx   ? Stomach cancer Neg Hx   ? ? ?Social History  ? ?Socioeconomic History  ? Marital status: Divorced  ?  Spouse name: Not on file  ? Number of children: 0  ? Years of education: Not on file  ? Highest education level: Not on file  ?Occupational History  ? Occupation: Office manager  ?Tobacco Use  ? Smoking status: Never  ? Smokeless tobacco: Never  ?Vaping Use  ? Vaping Use: Never used  ?Substance and Sexual Activity  ? Alcohol use: Yes  ?  Alcohol/week: 1.0 standard drink  ?  Types: 1 Standard drinks or equivalent per week  ?   Comment: 2 drinks per month  ? Drug use: Never  ? Sexual activity: Not Currently  ?  Partners: Male  ?  Birth control/protection: Post-menopausal  ?Other Topics Concern  ? Not on file  ?Social History Narrative  ? Not on file  ? ?Social Determinants of Health  ? ?Financial Resource Strain: Not on file  ?Food Insecurity: Not on file  ?Transportation Needs: Not on file  ?Physical Activity: Not on file  ?Stress: Not on file  ?Social Connections: Not on file  ?Intimate Partner Violence: Not on file  ? ? ?Outpatient Medications Prior to Visit  ?Medication Sig Dispense Refill  ? acyclovir (ZOVIRAX) 400 MG tablet Two tablet daily for HSV suppression (Patient taking differently: as needed. Two tablet daily for HSV suppression) 180 tablet 4  ? ALPRAZolam (XANAX) 1 MG tablet Take 1 mg by mouth at bedtime as needed.    ? aspirin 81 MG chewable tablet Chew 81 mg by mouth daily.    ? Cholecalciferol (D3 PO) Take 1 tablet by mouth daily.    ? olopatadine (PATANOL) 0.1 %  ophthalmic solution Place 1 drop into both eyes 2 (two) times daily. 5 mL 12  ? progesterone (PROMETRIUM) 100 MG capsule TAKE 1 TO 2 CAPSULES BY MOUTH EVERY NIGHT AT BEDTIME    ? Zinc Acetate, Oral, (ZINC ACETATE PO) Take 1 tablet by mouth daily.    ? zolpidem (AMBIEN CR) 12.5 MG CR tablet Take 12.5 mg by mouth at bedtime as needed.    ? QUVIVIQ 50 MG TABS Take 1 tablet by mouth at bedtime.    ? UBIQUINOL LIPOSOMAL PO Take 1,600 mg by mouth daily.    ? UNABLE TO FIND Bio-identical testosterone pellet    ? ?No facility-administered medications prior to visit.  ? ? ?Allergies  ?Allergen Reactions  ? Lipitor [Atorvastatin] Other (See Comments)  ?  Myalgias  ? Amoxicillin-Pot Clavulanate Diarrhea and Other (See Comments)  ? Codeine Other (See Comments)  ? Crestor [Rosuvastatin]   ?  Body aches   ? Hydrocodone Nausea And Vomiting  ? Zetia [Ezetimibe]   ?  Muscle pain.   ? ? ?Review of Systems  ?Constitutional:  Negative for chills, fatigue and fever.  ?HENT:  Positive  for ear pain, postnasal drip and sinus pressure. Negative for congestion and sore throat.   ?Respiratory:  Negative for cough and shortness of breath.   ?Cardiovascular:  Negative for chest pain and palpitations.  ?Gastrointestinal:  Negative for abdominal pain, constipation, diarrhea, nausea and vomiting.  ?Endocrine: Negative for polydipsia, polyphagia and polyuria.  ?Genitourinary:  Negative for difficulty urinating and dysuria.  ?Musculoskeletal:  Negative for arthralgias, back pain and myalgias.  ?Skin:  Negative for rash.  ?Neurological:  Negative for headaches.  ?Psychiatric/Behavioral:  Negative for dysphoric mood. The patient is not nervous/anxious.   ? ?   ?Objective:  ?  ?Physical Exam ?Vitals reviewed.  ?Constitutional:   ?   General: She is not in acute distress. ?   Appearance: Normal appearance.  ?HENT:  ?   Head: Normocephalic.  ?   Left Ear: Tympanic membrane normal.  ?   Ears:  ?   Comments: Air fluid levels both ears ?   Nose: Congestion present.  ?Eyes:  ?   Conjunctiva/sclera: Conjunctivae normal.  ?   Pupils: Pupils are equal, round, and reactive to light.  ?   Comments: Internal hordeoleum left eye  ?Cardiovascular:  ?   Rate and Rhythm: Normal rate and regular rhythm.  ?   Pulses: Normal pulses.  ?   Heart sounds: Normal heart sounds. No murmur heard. ?  No gallop.  ?Pulmonary:  ?   Effort: Pulmonary effort is normal. No respiratory distress.  ?   Breath sounds: Normal breath sounds. No wheezing.  ?Abdominal:  ?   General: Abdomen is flat. Bowel sounds are normal. There is no distension.  ?   Tenderness: There is no abdominal tenderness.  ?Musculoskeletal:     ?   General: Normal range of motion.  ?   Cervical back: Normal range of motion.  ?   Right lower leg: No edema.  ?   Left lower leg: No edema.  ?Skin: ?   General: Skin is warm.  ?   Capillary Refill: Capillary refill takes less than 2 seconds.  ?Neurological:  ?   General: No focal deficit present.  ?   Mental Status: She is alert and  oriented to person, place, and time.  ?Psychiatric:     ?   Mood and Affect: Mood normal.  ? ? ?BP 110/70  Pulse 76   Temp 98.1 ?F (36.7 ?C)   Resp 15   Ht $R'5\' 3"'qS$  (1.6 m)   Wt 189 lb (85.7 kg)   LMP 07/21/2014   SpO2 97%   BMI 33.48 kg/m?  ?Wt Readings from Last 3 Encounters:  ?11/22/21 189 lb (85.7 kg)  ?09/15/21 185 lb (83.9 kg)  ?09/01/21 185 lb (83.9 kg)  ? ? ?Health Maintenance Due  ?Topic Date Due  ? URINE MICROALBUMIN  Never done  ? TETANUS/TDAP  Never done  ? PAP SMEAR-Modifier  05/28/2021  ? ? ?There are no preventive care reminders to display for this patient. ? ? ?Lab Results  ?Component Value Date  ? TSH 1.020 01/12/2021  ? ?Lab Results  ?Component Value Date  ? WBC 4.0 04/18/2021  ? HGB 13.8 04/18/2021  ? HCT 40.6 04/18/2021  ? MCV 95 04/18/2021  ? PLT 264 04/18/2021  ? ?Lab Results  ?Component Value Date  ? NA 139 04/18/2021  ? K 4.5 04/18/2021  ? CO2 23 04/18/2021  ? GLUCOSE 111 (H) 04/18/2021  ? BUN 19 04/18/2021  ? CREATININE 0.88 04/18/2021  ? BILITOT 0.4 04/18/2021  ? ALKPHOS 82 04/18/2021  ? AST 16 04/18/2021  ? ALT 6 04/18/2021  ? PROT 6.2 04/18/2021  ? ALBUMIN 4.1 04/18/2021  ? CALCIUM 9.3 04/18/2021  ? EGFR 75 04/18/2021  ? ?Lab Results  ?Component Value Date  ? CHOL 200 (H) 04/18/2021  ? ?Lab Results  ?Component Value Date  ? HDL 52 04/18/2021  ? ?Lab Results  ?Component Value Date  ? [place] 130 (H) 04/18/2021  ? ?Lab Results  ?Component Value Date  ? TRIG 99 04/18/2021  ? ?Lab Results  ?Component Value Date  ? CHOLHDL 3.8 04/18/2021  ? ?Lab Results  ?Component Value Date  ? HGBA1C 5.5 04/18/2021  ? ? ?   ?Assessment & Plan:  ? ?Problem List Items Addressed This Visit   ?None ?Visit Diagnoses   ? ? Acute non-recurrent maxillary sinusitis    -  Primary  ? Relevant Medications  ? amoxicillin (AMOXIL) 875 MG tablet  ? cefTRIAXone (ROCEPHIN) injection 1 g (Start on 11/22/2021  3:15 PM) ?Patient is having acute sinusitis  ? ?  ? ? ? ?  ? ?Follow-up: Return if symptoms worsen or fail to  improve. ? ?An After Visit Summary was printed and given to the patient. ? ?Reinaldo Meeker, MD ?[place] ?[phone] ?

## 2021-11-22 NOTE — Telephone Encounter (Signed)
Patient states [facility] doesn't have medication, sending to [facility] in [place] per patient request. ?

## 2021-11-30 ENCOUNTER — Ambulatory Visit
Admission: RE | Admit: 2021-11-30 | Discharge: 2021-11-30 | Disposition: A | Discharge status: Home / Self Care | Payer: BC Managed Care – PPO | Source: Ambulatory Visit | Attending: Obstetrics and Gynecology | Admitting: Obstetrics and Gynecology

## 2021-11-30 DIAGNOSIS — M8589 Other specified disorders of bone density and structure, multiple sites: Secondary | ICD-10-CM | POA: Diagnosis not present

## 2021-11-30 DIAGNOSIS — Z780 Asymptomatic menopausal state: Secondary | ICD-10-CM

## 2021-11-30 DIAGNOSIS — M8580 Other specified disorders of bone density and structure, unspecified site: Secondary | ICD-10-CM

## 2021-12-02 DIAGNOSIS — F339 Major depressive disorder, recurrent, unspecified: Secondary | ICD-10-CM | POA: Diagnosis not present

## 2021-12-02 DIAGNOSIS — F5101 Primary insomnia: Secondary | ICD-10-CM | POA: Diagnosis not present

## 2021-12-02 DIAGNOSIS — F411 Generalized anxiety disorder: Secondary | ICD-10-CM | POA: Diagnosis not present

## 2021-12-13 DIAGNOSIS — H00025 Hordeolum internum left lower eyelid: Secondary | ICD-10-CM | POA: Diagnosis not present

## 2022-01-06 NOTE — Progress Notes (Signed)
Subjective:  Patient ID: Shelby Barnes, female    DOB: 10-30-1960  Age: 61 y.o. MRN: [id number]  Chief Complaint  Patient presents with   Hyperlipidemia   HPI: Hyperlipidemia: Patient has not tolerated Crestor or Lipitor.  Nor is she tolerating Zetia.  She has had muscle cramps with all of these medications.    Wt Mgmt: Patient has not tolerated Ozempic or Saxenda.  She is struggling with weight loss.  She is quite frustrated.  She does try to eat low-carb, low sugar, low fat.  She is very active and exercises regularly.     Current Outpatient Medications on File Prior to Visit  Medication Sig Dispense Refill   acyclovir (ZOVIRAX) 400 MG tablet Two tablet daily for HSV suppression (Patient taking differently: as needed. Two tablet daily for HSV suppression) 180 tablet 4   ALPRAZolam (XANAX) 1 MG tablet Take 1 mg by mouth at bedtime as needed.     aspirin 81 MG chewable tablet Chew 81 mg by mouth daily.     Cholecalciferol (D3 PO) Take 1 tablet by mouth daily.     olopatadine (PATANOL) 0.1 % ophthalmic solution Place 1 drop into both eyes 2 (two) times daily. 5 mL 12   progesterone (PROMETRIUM) 100 MG capsule TAKE 1 TO 2 CAPSULES BY MOUTH EVERY NIGHT AT BEDTIME     Zinc Acetate, Oral, (ZINC ACETATE PO) Take 1 tablet by mouth daily.     zolpidem (AMBIEN CR) 12.5 MG CR tablet Take 12.5 mg by mouth at bedtime as needed.     No current facility-administered medications on file prior to visit.   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   Past Surgical History:  Procedure Laterality Date   BREAST EXCISIONAL BIOPSY Left 1998   Fibroadenoma   BREAST SURGERY  Left 09/1996   fibroadenema   COLONOSCOPY  05/2011    Family History  Problem Relation Age of Onset   Diabetes Mother 75       Whipple Procedure from cyst on pancrease   Heart failure Father    Breast cancer Maternal Aunt 70       with metasis   Diabetes Maternal Grandmother    Stroke Paternal Grandmother    Heart failure Paternal Grandfather    Colon cancer Neg Hx    Colon polyps Neg Hx    Esophageal cancer Neg Hx    Rectal cancer Neg Hx    Stomach cancer Neg Hx    Social History   Socioeconomic History   Marital status: Divorced    Spouse name: Not on file   Number of children: 0   Years of education: Not on file   Highest education level: Not on file  Occupational History   Occupation: Office manager  Tobacco Use   Smoking status: Never   Smokeless tobacco: Never  Vaping Use   Vaping Use: Never used  Substance and Sexual Activity   Alcohol use: Yes    Alcohol/week: 1.0 standard drink    Types: 1 Standard drinks  or equivalent per week    Comment: 2 drinks per month   Drug use: Never   Sexual activity: Not Currently    Partners: Male    Birth control/protection: Post-menopausal  Other Topics Concern   Not on file  Social History Narrative   Not on file   Social Determinants of Health   Financial Resource Strain: Not on file  Food Insecurity: Not on file  Transportation Needs: Not on file  Physical Activity: Not on file  Stress: Not on file  Social Connections: Not on file    Review of Systems  Constitutional:  Positive for fatigue. Negative for appetite change and fever.  HENT:  Positive for congestion and rhinorrhea (neti pot, allergy and sinus med). Negative for ear pain, sinus pressure and sore throat.   Respiratory:  Negative for cough, chest tightness, shortness of breath and wheezing.   Cardiovascular:  Negative for chest pain and palpitations.  Gastrointestinal:  Positive for abdominal pain (cramping) and constipation (never feels she  empties bowels. pt has not tried taking anything.). Negative for diarrhea, nausea and vomiting.  Genitourinary:  Negative for dysuria and hematuria.  Musculoskeletal:  Negative for arthralgias, back pain, joint swelling and myalgias.  Skin:  Negative for rash.  Neurological:  Negative for dizziness, weakness and headaches.  Psychiatric/Behavioral:  Negative for dysphoric mood. The patient is not nervous/anxious.     Objective:  BP 138/72   Pulse 73   Temp (!) 97.3 F (36.3 C)   Ht '5\' 3"'$  (1.6 m)   Wt 193 lb (87.5 kg)   LMP 07/21/2014   SpO2 100%   BMI 34.19 kg/m      01/09/2022    7:35 AM 11/22/2021    2:47 PM 09/15/2021    9:36 AM  BP/Weight  Systolic BP 188 416 606  Diastolic BP 72 70 57  Wt. (Lbs) 193 189   BMI 34.19 kg/m2 33.48 kg/m2     Physical Exam Vitals reviewed.  Constitutional:      Appearance: Normal appearance. She is normal weight.  Cardiovascular:     Rate and Rhythm: Normal rate and regular rhythm.     Pulses: Normal pulses.     Heart sounds: Normal heart sounds.  Pulmonary:     Effort: Pulmonary effort is normal.     Breath sounds: Normal breath sounds.  Abdominal:     General: Abdomen is flat. Bowel sounds are normal.     Palpations: Abdomen is soft.  Neurological:     Mental Status: She is alert and oriented to person, place, and time.  Psychiatric:        Mood and Affect: Mood normal.        Behavior: Behavior normal.    Diabetic Foot Exam - Simple   No data filed      Lab Results  Component Value Date   WBC 4.0 04/18/2021   HGB 13.8 04/18/2021   HCT 40.6 04/18/2021   PLT 264 04/18/2021   GLUCOSE 111 (H) 04/18/2021   CHOL 200 (H) 04/18/2021   TRIG 99 04/18/2021   HDL 52 04/18/2021   LDLCALC 130 (H) 04/18/2021   ALT 6 04/18/2021   AST 16 04/18/2021   NA 139 04/18/2021   K 4.5 04/18/2021   CL 103 04/18/2021   CREATININE 0.88 04/18/2021   BUN 19 04/18/2021   CO2 23 04/18/2021   TSH 1.020 01/12/2021   HGBA1C 5.5 04/18/2021       Assessment & Plan:  Problem List Items Addressed This Visit       Cardiovascular and Mediastinum   Elevated coronary artery calcium score    High risk for heart disease. Start Repatha.       Relevant Medications   Evolocumab (REPATHA) 140 MG/ML SOSY     Other   Hyperlipidemia - Primary    Start Repatha 140 mg once every 2 weeks.  Sample given.  Will work on [place].  Recommend continue low-fat diet and exercise.       Relevant Medications   Evolocumab (REPATHA) 140 MG/ML SOSY   Other Relevant Orders   CBC With Diff/Platelet   Comprehensive metabolic panel   Lipid panel   Prediabetes    Recommend low sugar diet.  Recommend exercise.       Relevant Orders   Hemoglobin A1c   Class 1 obesity due to excess calories with serious comorbidity and body mass index (BMI) of 31.0 to 31.9 in adult    Recommend low-fat diet, low sugar diet and exercise. Recommend start phentermine 37.5 mg every morning.       Relevant Medications   phentermine 37.5 MG capsule   RESOLVED: Primary insomnia   Family history of heart disease    Start Repatha 140 mg injection every 2 weeks.  Sample given.  Will need Prior authorization.  Will await cholesterol labs.        Other constipation    Start MiraLAX 1 tablespoon mixed with Metamucil 1 tablespoon daily.       Myalgia due to statin    Intolerant to statins.  Intolerant to Zetia also.      .  Meds ordered this encounter  Medications   phentermine 37.5 MG capsule    Sig: Take 1 capsule (37.5 mg total) by mouth every morning.    Dispense:  30 capsule    Refill:  1   Evolocumab (REPATHA) 140 MG/ML SOSY    Sig: Inject 140 mg into the skin every 14 (fourteen) days.    Dispense:  6 mL    Refill:  0    Orders Placed This Encounter  Procedures   CBC With Diff/Platelet   Comprehensive metabolic panel   Lipid panel   Hemoglobin A1c     Follow-up: Return in about 6 weeks (around 02/20/2022) for chronic follow up.  An  After Visit Summary was printed and given to the patient.  I,Lauren M Auman,acting as a scribe for Rochel Brome, MD.,have documented all relevant documentation on the behalf of Rochel Brome, MD,as directed by  Rochel Brome, MD while in the presence of Rochel Brome, MD.    Rochel Brome, MD [place] [phone]

## 2022-01-09 ENCOUNTER — Encounter: Payer: Self-pay | Admitting: Family Medicine

## 2022-01-09 ENCOUNTER — Ambulatory Visit: Payer: BC Managed Care – PPO | Admitting: Family Medicine

## 2022-01-09 VITALS — BP 138/72 | HR 73 | Temp 97.3°F | Ht 63.0 in | Wt 193.0 lb

## 2022-01-09 DIAGNOSIS — Z8249 Family history of ischemic heart disease and other diseases of the circulatory system: Secondary | ICD-10-CM

## 2022-01-09 DIAGNOSIS — T466X5A Adverse effect of antihyperlipidemic and antiarteriosclerotic drugs, initial encounter: Secondary | ICD-10-CM

## 2022-01-09 DIAGNOSIS — E782 Mixed hyperlipidemia: Secondary | ICD-10-CM | POA: Diagnosis not present

## 2022-01-09 DIAGNOSIS — F5101 Primary insomnia: Secondary | ICD-10-CM

## 2022-01-09 DIAGNOSIS — M791 Myalgia, unspecified site: Secondary | ICD-10-CM | POA: Insufficient documentation

## 2022-01-09 DIAGNOSIS — K5909 Other constipation: Secondary | ICD-10-CM | POA: Insufficient documentation

## 2022-01-09 DIAGNOSIS — R931 Abnormal findings on diagnostic imaging of heart and coronary circulation: Secondary | ICD-10-CM

## 2022-01-09 DIAGNOSIS — Z6831 Body mass index (BMI) 31.0-31.9, adult: Secondary | ICD-10-CM

## 2022-01-09 DIAGNOSIS — E6609 Other obesity due to excess calories: Secondary | ICD-10-CM

## 2022-01-09 DIAGNOSIS — R7303 Prediabetes: Secondary | ICD-10-CM | POA: Diagnosis not present

## 2022-01-09 HISTORY — DX: Family history of ischemic heart disease and other diseases of the circulatory system: Z82.49

## 2022-01-09 HISTORY — DX: Myalgia, unspecified site: M79.10

## 2022-01-09 HISTORY — DX: Other constipation: K59.09

## 2022-01-09 HISTORY — DX: Adverse effect of antihyperlipidemic and antiarteriosclerotic drugs, initial encounter: T46.6X5A

## 2022-01-09 MED ORDER — PHENTERMINE HCL 37.5 MG PO CAPS: 37.5000 mg | ORAL_CAPSULE | ORAL | 1 refills | Status: DC

## 2022-01-09 MED ORDER — REPATHA 140 MG/ML ~~LOC~~ SOSY: 140.0000 mg | PREFILLED_SYRINGE | SUBCUTANEOUS | 0 refills | Status: DC

## 2022-01-09 NOTE — Assessment & Plan Note (Signed)
High risk for heart disease. Start Repatha.

## 2022-01-09 NOTE — Assessment & Plan Note (Signed)
Start Repatha 140 mg injection every 2 weeks.  Sample given.  Will need Prior authorization.  Will await cholesterol labs.

## 2022-01-09 NOTE — Patient Instructions (Signed)
This start MiraLAX 1 tablespoon mixed with Metamucil 1 tablespoon daily. Start phentermine 37.5 mg every morning for weight loss. Start Repatha 140 mg injection every 2 weeks.  Sample given.  Will need Prior authorization.  Will await cholesterol labs.

## 2022-01-09 NOTE — Assessment & Plan Note (Signed)
Recommend low-fat diet, low sugar diet and exercise. Recommend start phentermine 37.5 mg every morning.

## 2022-01-09 NOTE — Assessment & Plan Note (Signed)
Intolerant to statins.  Intolerant to Zetia also.

## 2022-01-09 NOTE — Assessment & Plan Note (Signed)
Recommend low sugar diet.  Recommend exercise.

## 2022-01-09 NOTE — Assessment & Plan Note (Signed)
Start MiraLAX 1 tablespoon mixed with Metamucil 1 tablespoon daily.

## 2022-01-09 NOTE — Assessment & Plan Note (Signed)
Start Repatha 140 mg once every 2 weeks.  Sample given.  Will work on [place].  Recommend continue low-fat diet and exercise.

## 2022-01-10 LAB — CBC WITH DIFF/PLATELET
Basophils Absolute: 0.1 10*3/uL (ref 0.0–0.2)
Basos: 2 %
EOS (ABSOLUTE): 0.1 10*3/uL (ref 0.0–0.4)
Eos: 2 %
Hematocrit: 42.1 % (ref 34.0–46.6)
Hemoglobin: 14.5 g/dL (ref 11.1–15.9)
Immature Grans (Abs): 0 10*3/uL (ref 0.0–0.1)
Immature Granulocytes: 1 %
Lymphocytes Absolute: 1.5 10*3/uL (ref 0.7–3.1)
Lymphs: 27 %
MCH: 33 pg (ref 26.6–33.0)
MCHC: 34.4 g/dL (ref 31.5–35.7)
MCV: 96 fL (ref 79–97)
Monocytes Absolute: 0.5 10*3/uL (ref 0.1–0.9)
Monocytes: 9 %
Neutrophils Absolute: 3.3 10*3/uL (ref 1.4–7.0)
Neutrophils: 59 %
Platelets: 248 10*3/uL (ref 150–450)
RBC: 4.4 x10E6/uL (ref 3.77–5.28)
RDW: 13.2 % (ref 11.7–15.4)
WBC: 5.4 10*3/uL (ref 3.4–10.8)

## 2022-01-10 LAB — COMPREHENSIVE METABOLIC PANEL
ALT: 8 IU/L (ref 0–32)
AST: 17 IU/L (ref 0–40)
Albumin/Globulin Ratio: 2 (ref 1.2–2.2)
Albumin: 4.3 g/dL (ref 3.8–4.8)
Alkaline Phosphatase: 92 IU/L (ref 44–121)
BUN/Creatinine Ratio: 20 (ref 12–28)
BUN: 17 mg/dL (ref 8–27)
Bilirubin Total: 0.6 mg/dL (ref 0.0–1.2)
CO2: 21 mmol/L (ref 20–29)
Calcium: 9.6 mg/dL (ref 8.7–10.3)
Chloride: 105 mmol/L (ref 96–106)
Creatinine, Ser: 0.84 mg/dL (ref 0.57–1.00)
Globulin, Total: 2.2 g/dL (ref 1.5–4.5)
Glucose: 132 mg/dL — ABNORMAL HIGH (ref 70–99)
Potassium: 4.6 mmol/L (ref 3.5–5.2)
Sodium: 140 mmol/L (ref 134–144)
Total Protein: 6.5 g/dL (ref 6.0–8.5)
eGFR: 79 mL/min/{1.73_m2} (ref >59)

## 2022-01-10 LAB — LIPID PANEL
Chol/HDL Ratio: 3.4 ratio (ref 0.0–4.4)
Cholesterol, Total: 232 mg/dL — ABNORMAL HIGH (ref 100–199)
HDL: 68 mg/dL (ref >39)
LDL Chol Calc (NIH): 147 mg/dL — ABNORMAL HIGH (ref 0–99)
Triglycerides: 97 mg/dL (ref 0–149)
VLDL Cholesterol Cal: 17 mg/dL (ref 5–40)

## 2022-01-10 LAB — CARDIOVASCULAR RISK ASSESSMENT

## 2022-01-10 LAB — HEMOGLOBIN A1C
Est. average glucose Bld gHb Est-mCnc: 120 mg/dL
Hgb A1c MFr Bld: 5.8 % — ABNORMAL HIGH (ref 4.8–5.6)

## 2022-01-11 ENCOUNTER — Telehealth: Payer: Self-pay

## 2022-01-11 ENCOUNTER — Other Ambulatory Visit: Payer: Self-pay | Admitting: Nurse Practitioner

## 2022-01-11 DIAGNOSIS — E6609 Other obesity due to excess calories: Secondary | ICD-10-CM

## 2022-01-11 MED ORDER — PHENTERMINE HCL 15 MG PO CAPS: 15.0000 mg | ORAL_CAPSULE | ORAL | 0 refills | Status: DC

## 2022-01-11 NOTE — Telephone Encounter (Signed)
Patient took first dose of phentermine 37.5 mg yesterday around 10 am. This did make her feel anxious and last night she could not sleep. Although had these symptoms she did feel it helped appetite yesterday. She is questioning if she can have lower dose.   Harrell Lark 01/11/22 9:36 AM

## 2022-01-11 NOTE — Telephone Encounter (Signed)
It is a capsule.  Royce Macadamia, [place] 01/11/22 11:22 AM

## 2022-01-12 ENCOUNTER — Other Ambulatory Visit: Payer: Self-pay | Admitting: Nurse Practitioner

## 2022-01-12 DIAGNOSIS — E6609 Other obesity due to excess calories: Secondary | ICD-10-CM

## 2022-01-12 MED ORDER — PHENTERMINE HCL 37.5 MG PO TABS: 18.7500 mg | ORAL_TABLET | Freq: Every day | ORAL | 0 refills | Status: DC

## 2022-01-12 NOTE — Telephone Encounter (Signed)
Left detailed VM making patient aware.  Royce Macadamia, [place] 01/12/22 12:02 PM

## 2022-01-12 NOTE — Telephone Encounter (Signed)
Left detailed VM making patient aware.  Shelby Barnes, [place] 01/12/22 9:27 AM

## 2022-01-12 NOTE — Telephone Encounter (Signed)
Patient returned call. Patient spoke with pharmacy yesterday they do not have the lower dose capsules in stock. However they do have the 37.5 mg tablets. She is requesting the 37.5 mg tablets and she will take 1/2 tablet daily. Please advise.   Harrell Lark 01/12/22 9:31 AM

## 2022-02-03 DIAGNOSIS — F339 Major depressive disorder, recurrent, unspecified: Secondary | ICD-10-CM | POA: Diagnosis not present

## 2022-02-03 DIAGNOSIS — F411 Generalized anxiety disorder: Secondary | ICD-10-CM | POA: Diagnosis not present

## 2022-02-03 DIAGNOSIS — F5101 Primary insomnia: Secondary | ICD-10-CM | POA: Diagnosis not present

## 2022-02-20 NOTE — Progress Notes (Unsigned)
Subjective:  Patient ID: Shelby Barnes, female    DOB: [birth year]  Age: 61 y.o. MRN: [id number]  Chief Complaint  Patient presents with   Hyperlipidemia   Weight Loss    6 week follow up    Hyperlipidemia: Patient was started on Repatha '120mg'$  inject once every 2 weeks starting 6 weeks ago. Patient states she has had two injections so far and has not had any side effects except she states she has been bruising a little more easier.  Weight loss: Patient is currently taking Phentermine 37.5 mg 1 tablet daily. Patient is now 3 pounds down from last visit which patient had stopped the medication for about a week and re-started it due to being under stress at work but patient states she is doing great on the medication now.   Current Outpatient Medications on File Prior to Visit  Medication Sig Dispense Refill   acyclovir (ZOVIRAX) 400 MG tablet Two tablet daily for HSV suppression (Patient taking differently: as needed. Two tablet daily for HSV suppression) 180 tablet 4   ALPRAZolam (XANAX) 1 MG tablet Take 1 mg by mouth at bedtime as needed.     AMBIEN 10 MG tablet Take 5-10 mg by mouth at bedtime as needed.     aspirin 81 MG chewable tablet Chew 81 mg by mouth daily.     Cholecalciferol (D3 PO) Take 1 tablet by mouth daily.     doxepin (SINEQUAN) 10 MG capsule Take 10 mg by mouth at bedtime.     olopatadine (PATANOL) 0.1 % ophthalmic solution Place 1 drop into both eyes 2 (two) times daily. 5 mL 12   phentermine (ADIPEX-P) 37.5 MG tablet Take 0.5 tablets (18.75 mg total) by mouth daily before breakfast. 30 tablet 0   progesterone (PROMETRIUM) 100 MG capsule TAKE 1 TO 2 CAPSULES BY MOUTH EVERY NIGHT AT BEDTIME     REPATHA SURECLICK 244 MG/ML SOAJ Inject 1 Syringe into the skin every 14 (fourteen) days.     Zinc Acetate, Oral, (ZINC ACETATE PO) Take 1 tablet by mouth daily.     No current facility-administered medications on file prior to visit.   Past Medical History:  Diagnosis  Date   Allergy 09/01/2021   fall- seasonal   Anxiety    Binge eating    Depression    Dysmenorrhea    Fibroid    Genital HSV 1981   Hypercholesterolemia    Insomnia    Kidney stones 2001   Low back pain    Pelvic pain    Sinusitis    STD (sexually transmitted disease) 1981   herpes   Subserous leiomyoma of uterus 06/14/2014   Past Surgical History:  Procedure Laterality Date   BREAST EXCISIONAL BIOPSY Left 1998   Fibroadenoma   BREAST SURGERY Left 09/1996   fibroadenema   COLONOSCOPY  05/2011    Family History  Problem Relation Age of Onset   Diabetes Mother 49       Whipple Procedure from cyst on pancrease   Heart failure Father    Breast cancer Maternal Aunt 70       with metasis   Diabetes Maternal Grandmother    Stroke Paternal Grandmother    Heart failure Paternal Grandfather    Colon cancer Neg Hx    Colon polyps Neg Hx    Esophageal cancer Neg Hx    Rectal cancer Neg Hx    Stomach cancer Neg Hx    Social History   Socioeconomic  History   Marital status: Divorced    Spouse name: Not on file   Number of children: 0   Years of education: Not on file   Highest education level: Not on file  Occupational History   Occupation: Office manager  Tobacco Use   Smoking status: Never   Smokeless tobacco: Never  Vaping Use   Vaping Use: Never used  Substance and Sexual Activity   Alcohol use: Yes    Alcohol/week: 1.0 standard drink of alcohol    Types: 1 Standard drinks or equivalent per week    Comment: 2 drinks per month   Drug use: Never   Sexual activity: Not Currently    Partners: Male    Birth control/protection: Post-menopausal  Other Topics Concern   Not on file  Social History Narrative   Not on file   Social Determinants of Health   Financial Resource Strain: Not on file  Food Insecurity: Not on file  Transportation Needs: Not on file  Physical Activity: Not on file  Stress: Not on file  Social Connections: Not on file     Review of Systems  Constitutional:  Negative for appetite change, fatigue and fever.  HENT:  Negative for congestion, ear pain, sinus pressure and sore throat.   Respiratory:  Negative for cough, chest tightness, shortness of breath and wheezing.   Cardiovascular:  Negative for chest pain and palpitations.  Gastrointestinal:  Negative for abdominal pain, constipation, diarrhea, nausea and vomiting.  Genitourinary:  Negative for dysuria and hematuria.  Musculoskeletal:  Negative for arthralgias, back pain, joint swelling and myalgias.  Skin:  Negative for rash.  Neurological:  Negative for dizziness, weakness and headaches.  Psychiatric/Behavioral:  Negative for dysphoric mood. The patient is not nervous/anxious.      Objective:  BP 128/76 (BP Location: Left Arm, Patient Position: Sitting)   Pulse 78   Temp 97.8 F (36.6 C) (Temporal)   Ht '5\' 3"'$  (1.6 m)   Wt 190 lb (86.2 kg)   LMP 07/21/2014   SpO2 99%   BMI 33.66 kg/m      02/23/2022    8:34 AM 01/09/2022    7:35 AM 11/22/2021    2:47 PM  BP/Weight  Systolic BP 938 101 751  Diastolic BP 76 72 70  Wt. (Lbs) 190 193 189  BMI 33.66 kg/m2 34.19 kg/m2 33.48 kg/m2    Physical Exam Vitals reviewed.  Constitutional:      Appearance: Normal appearance. She is obese.  Cardiovascular:     Rate and Rhythm: Normal rate and regular rhythm.     Heart sounds: Normal heart sounds.  Pulmonary:     Effort: Pulmonary effort is normal.     Breath sounds: Normal breath sounds.  Abdominal:     General: Abdomen is flat. Bowel sounds are normal.     Palpations: Abdomen is soft.  Neurological:     Mental Status: She is alert and oriented to person, place, and time.  Psychiatric:        Mood and Affect: Mood normal.        Behavior: Behavior normal.     Diabetic Foot Exam - Simple   No data filed      Lab Results  Component Value Date   WBC 5.4 01/09/2022   HGB 14.5 01/09/2022   HCT 42.1 01/09/2022   PLT 248 01/09/2022    GLUCOSE 132 (H) 01/09/2022   CHOL 232 (H) 01/09/2022   TRIG 97 01/09/2022   HDL 68  01/09/2022   LDLCALC 147 (H) 01/09/2022   ALT 8 01/09/2022   AST 17 01/09/2022   NA 140 01/09/2022   K 4.6 01/09/2022   CL 105 01/09/2022   CREATININE 0.84 01/09/2022   BUN 17 01/09/2022   CO2 21 01/09/2022   TSH 1.020 01/12/2021   HGBA1C 5.8 (H) 01/09/2022      Assessment & Plan:   Problem List Items Addressed This Visit       Other   Hyperlipidemia - Primary    Continue current medicines. Too early to recheck lipids now. Return in 6 weeks fasting.   Continue to work on eating a healthy diet and exercise.       Relevant Medications   REPATHA SURECLICK 654 MG/ML SOAJ   Class 1 obesity due to excess calories with serious comorbidity and body mass index (BMI) of 33.0 to 33.9 in adult    Recommend continue to work on eating healthy diet and exercise. Continue phentermine.      .  No orders of the defined types were placed in this encounter.   No orders of the defined types were placed in this encounter.    Follow-up: Return in about 7 weeks (around 04/12/2022) for chronic fasting.  An After Visit Summary was printed and given to the patient.   I,Lauren M Auman,acting as a scribe for Rochel Brome, MD.,have documented all relevant documentation on the behalf of Rochel Brome, MD,as directed by  Rochel Brome, MD while in the presence of Rochel Brome, MD.   I,Loranzo Desha,acting as a scribe for Rochel Brome, MD.,have documented all relevant documentation on the behalf of Rochel Brome, MD,as directed by  Rochel Brome, MD while in the presence of Rochel Brome, MD.    Rochel Brome, MD [place] [phone]

## 2022-02-23 ENCOUNTER — Ambulatory Visit: Payer: BC Managed Care – PPO | Admitting: Family Medicine

## 2022-02-23 ENCOUNTER — Encounter: Payer: Self-pay | Admitting: Family Medicine

## 2022-02-23 VITALS — BP 128/76 | HR 78 | Temp 97.8°F | Ht 63.0 in | Wt 190.0 lb

## 2022-02-23 DIAGNOSIS — Z6833 Body mass index (BMI) 33.0-33.9, adult: Secondary | ICD-10-CM

## 2022-02-23 DIAGNOSIS — E6609 Other obesity due to excess calories: Secondary | ICD-10-CM

## 2022-02-23 DIAGNOSIS — E782 Mixed hyperlipidemia: Secondary | ICD-10-CM | POA: Diagnosis not present

## 2022-02-26 NOTE — Assessment & Plan Note (Signed)
Recommend continue to work on eating healthy diet and exercise. Continue phentermine. 

## 2022-02-26 NOTE — Assessment & Plan Note (Signed)
Well controlled.  ?No changes to medicines.  ?Continue to work on eating a healthy diet and exercise.  ?Labs drawn today.  ?

## 2022-03-01 ENCOUNTER — Other Ambulatory Visit: Payer: Self-pay

## 2022-03-01 DIAGNOSIS — F32A Depression, unspecified: Secondary | ICD-10-CM | POA: Insufficient documentation

## 2022-03-01 DIAGNOSIS — M545 Low back pain, unspecified: Secondary | ICD-10-CM | POA: Insufficient documentation

## 2022-03-01 DIAGNOSIS — F329 Major depressive disorder, single episode, unspecified: Secondary | ICD-10-CM | POA: Insufficient documentation

## 2022-03-01 DIAGNOSIS — F419 Anxiety disorder, unspecified: Secondary | ICD-10-CM | POA: Insufficient documentation

## 2022-03-01 DIAGNOSIS — N946 Dysmenorrhea, unspecified: Secondary | ICD-10-CM | POA: Insufficient documentation

## 2022-03-01 DIAGNOSIS — R102 Pelvic and perineal pain: Secondary | ICD-10-CM | POA: Insufficient documentation

## 2022-03-01 DIAGNOSIS — D219 Benign neoplasm of connective and other soft tissue, unspecified: Secondary | ICD-10-CM | POA: Insufficient documentation

## 2022-03-01 DIAGNOSIS — G4700 Insomnia, unspecified: Secondary | ICD-10-CM | POA: Insufficient documentation

## 2022-03-01 DIAGNOSIS — F330 Major depressive disorder, recurrent, mild: Secondary | ICD-10-CM

## 2022-03-01 DIAGNOSIS — E7800 Pure hypercholesterolemia, unspecified: Secondary | ICD-10-CM | POA: Insufficient documentation

## 2022-03-01 DIAGNOSIS — J329 Chronic sinusitis, unspecified: Secondary | ICD-10-CM | POA: Insufficient documentation

## 2022-03-01 DIAGNOSIS — R632 Polyphagia: Secondary | ICD-10-CM | POA: Insufficient documentation

## 2022-03-01 HISTORY — DX: Major depressive disorder, recurrent, mild: F33.0

## 2022-03-02 ENCOUNTER — Encounter: Payer: Self-pay | Admitting: Cardiology

## 2022-03-02 ENCOUNTER — Ambulatory Visit: Payer: BC Managed Care – PPO | Admitting: Cardiology

## 2022-03-02 VITALS — BP 111/69 | HR 92 | Ht 63.0 in | Wt 190.8 lb

## 2022-03-02 DIAGNOSIS — E782 Mixed hyperlipidemia: Secondary | ICD-10-CM | POA: Diagnosis not present

## 2022-03-02 DIAGNOSIS — M791 Myalgia, unspecified site: Secondary | ICD-10-CM

## 2022-03-02 DIAGNOSIS — I2510 Atherosclerotic heart disease of native coronary artery without angina pectoris: Secondary | ICD-10-CM

## 2022-03-02 DIAGNOSIS — R931 Abnormal findings on diagnostic imaging of heart and coronary circulation: Secondary | ICD-10-CM | POA: Diagnosis not present

## 2022-03-02 DIAGNOSIS — T466X5A Adverse effect of antihyperlipidemic and antiarteriosclerotic drugs, initial encounter: Secondary | ICD-10-CM

## 2022-03-02 MED ORDER — NEXLETOL 180 MG PO TABS: 1.0000 | ORAL_TABLET | Freq: Every day | ORAL | 12 refills | Status: DC

## 2022-03-02 NOTE — Patient Instructions (Signed)
Medication Instructions:  Your physician has recommended you make the following change in your medication:   Stop Repatha  Start Nexlizet 1 tablet daily.  *If you need a refill on your cardiac medications before your next appointment, please call your pharmacy*   Lab Work: Your physician recommends that you have labs done in the office today. Your test included  basic metabolic panel and liver function.  Your physician recommends that you return for lab work in: 6 weeks You need to have labs done when you are fasting.  You can come Monday through Friday 8:30 am to 12:00 pm and 1:15 to 4:30. You do not need to make an appointment as the order has already been placed. The labs you are going to have done are BMET, LFT and Lipids.   If you have labs (blood work) drawn today and your tests are completely normal, you will receive your results only by: [place] (if you have MyChart) OR A paper copy in the mail If you have any lab test that is abnormal or we need to change your treatment, we will call you to review the results.   Testing/Procedures: None ordered   Follow-Up: At [facility], you and your health needs are our priority.  As part of our continuing mission to provide you with exceptional heart care, we have created designated Provider Care Teams.  These Care Teams include your primary Cardiologist (physician) and Advanced Practice Providers (APPs -  Physician Assistants and Nurse Practitioners) who all work together to provide you with the care you need, when you need it.  We recommend signing up for the patient portal called "MyChart".  Sign up information is provided on this After Visit Summary.  MyChart is used to connect with patients for Virtual Visits (Telemedicine).  Patients are able to view lab/test results, encounter notes, upcoming appointments, etc.  Non-urgent messages can be sent to your provider as well.   To learn more about what you can do with MyChart,  go to [email or web address].    Your next appointment:   6 month(s)  The format for your next appointment:   In Person  Provider:   Jyl Heinz, MD   Other Instructions NA

## 2022-03-02 NOTE — Progress Notes (Signed)
Cardiology Office Note:    Date:  03/02/2022   ID:  ARIE GABLE, DOB 13-Apr-1961, MRN [id number]  PCP:  Rochel Brome, MD  Cardiologist:  Jenean Lindau, MD   Referring MD: Rochel Brome, MD    ASSESSMENT:    1. Elevated coronary artery calcium score   2. Mixed hyperlipidemia    PLAN:    In order of problems listed above:  Elevated calcium score: Secondary prevention stressed.  Importance of compliance with diet and medication stressed and she vocalized understanding.  She was advised to walk at least half an hour a day 5 days a week and she promises to do so. Hyperlipidemia: She is intolerant to statins.  She has tried 3 statins with significant myalgias.  She tells me that Repatha recently gave her bad back cramps and she wants to try other medications.  She does not want to try Zetia as she does not tolerate it well.  I will do baseline blood work today and get her on Nexletol.  She will be back in 6 weeks for liver lipid check diet was emphasized. Obesity: Weight reduction stressed and she promises to do better.  Exercise stressed. Patient will be seen in follow-up appointment in 9 months or earlier if the patient has any concerns    Medication Adjustments/Labs and Tests Ordered: Current medicines are reviewed at length with the patient today.  Concerns regarding medicines are outlined above.  No orders of the defined types were placed in this encounter.  No orders of the defined types were placed in this encounter.    No chief complaint on file.    History of Present Illness:    Shelby Barnes is a 61 y.o. female.  Patient has past medical history of elevated calcium score, obesity and hyperlipidemia.  She denies any problems at this time and takes care of activities of daily living.  No chest pain orthopnea or PND.  She tries to walk on a regular basis.  At the time of my evaluation, the patient is alert awake oriented and in no distress.  Past Medical History:   Diagnosis Date   Allergy 09/01/2021   fall- seasonal   Anxiety    Binge eating    Class 1 obesity due to excess calories with serious comorbidity and body mass index (BMI) of 33.0 to 33.9 in adult 05/31/2021   Depression    Dysmenorrhea    Elevated coronary artery calcium score 03/17/2021   Family history of heart disease 01/09/2022   Fibroid    Genital HSV 1981   Hypercholesterolemia    Hyperlipidemia 03/17/2021   Insomnia    Kidney stones 2001   Low back pain    Major depressive disorder, single episode, unspecified 03/01/2022   Myalgia due to statin 01/09/2022   Other constipation 01/09/2022   Pelvic pain    Prediabetes 05/31/2021   Sinusitis    STD (sexually transmitted disease) 1981   herpes    Past Surgical History:  Procedure Laterality Date   BREAST EXCISIONAL BIOPSY Left 1998   Fibroadenoma   BREAST SURGERY Left 09/1996   fibroadenema   COLONOSCOPY  05/2011    Current Medications: Current Meds  Medication Sig   acyclovir (ZOVIRAX) 400 MG tablet Two tablet daily for HSV suppression   ALPRAZolam (XANAX) 1 MG tablet Take 1 mg by mouth at bedtime as needed for anxiety.   AMBIEN 10 MG tablet Take 5-10 mg by mouth at bedtime as needed for sleep.  aspirin 81 MG chewable tablet Chew 81 mg by mouth daily.   Cholecalciferol (D3 PO) Take 1 tablet by mouth daily.   doxepin (SINEQUAN) 10 MG capsule Take 10 mg by mouth at bedtime.   olopatadine (PATANOL) 0.1 % ophthalmic solution Place 1 drop into both eyes 2 (two) times daily.   phentermine (ADIPEX-P) 37.5 MG tablet Take 0.5 tablets (18.75 mg total) by mouth daily before breakfast.   progesterone (PROMETRIUM) 100 MG capsule TAKE 1 TO 2 CAPSULES BY MOUTH EVERY NIGHT AT BEDTIME   REPATHA SURECLICK 397 MG/ML SOAJ Inject 1 Syringe into the skin every 14 (fourteen) days.   Zinc Acetate, Oral, (ZINC ACETATE PO) Take 1 tablet by mouth daily.     Allergies:   Lipitor [atorvastatin], Amoxicillin-pot clavulanate, Codeine, Crestor  [rosuvastatin], Hydrocodone, and Zetia [ezetimibe]   Social History   Socioeconomic History   Marital status: Divorced    Spouse name: Not on file   Number of children: 0   Years of education: Not on file   Highest education level: Not on file  Occupational History   Occupation: Office manager  Tobacco Use   Smoking status: Never   Smokeless tobacco: Never  Vaping Use   Vaping Use: Never used  Substance and Sexual Activity   Alcohol use: Yes    Alcohol/week: 1.0 standard drink of alcohol    Types: 1 Standard drinks or equivalent per week    Comment: 2 drinks per month   Drug use: Never   Sexual activity: Not Currently    Partners: Male    Birth control/protection: Post-menopausal  Other Topics Concern   Not on file  Social History Narrative   Not on file   Social Determinants of Health   Financial Resource Strain: Not on file  Food Insecurity: Not on file  Transportation Needs: Not on file  Physical Activity: Not on file  Stress: Not on file  Social Connections: Not on file     Family History: The patient's family history includes Breast cancer (age of onset: 35) in her maternal aunt; Diabetes in her maternal grandmother; Diabetes (age of onset: 36) in her mother; Heart failure in her father and paternal grandfather; Stroke in her paternal grandmother. There is no history of Colon cancer, Colon polyps, Esophageal cancer, Rectal cancer, or Stomach cancer.  ROS:   Please see the history of present illness.    All other systems reviewed and are negative.  EKGs/Labs/Other Studies Reviewed:    The following studies were reviewed today: I discussed my findings with the patient at length.  EKG reveals sinus rhythm and nonspecific ST-T changes   Recent Labs: 01/09/2022: ALT 8; BUN 17; Creatinine, Ser 0.84; Hemoglobin 14.5; Platelets 248; Potassium 4.6; Sodium 140  Recent Lipid Panel    Component Value Date/Time   CHOL 232 (H) 01/09/2022 0802   TRIG 97  01/09/2022 0802   HDL 68 01/09/2022 0802   CHOLHDL 3.4 01/09/2022 0802   LDLCALC 147 (H) 01/09/2022 0802    Physical Exam:    VS:  BP 111/69   Pulse 92   Ht '5\' 3"'$  (1.6 m)   Wt 190 lb 12.8 oz (86.5 kg)   LMP 07/21/2014   SpO2 94%   BMI 33.80 kg/m     Wt Readings from Last 3 Encounters:  03/02/22 190 lb 12.8 oz (86.5 kg)  02/23/22 190 lb (86.2 kg)  01/09/22 193 lb (87.5 kg)     GEN: Patient is in no acute distress HEENT: Normal  NECK: No JVD; No carotid bruits LYMPHATICS: No lymphadenopathy CARDIAC: Hear sounds regular, 2/6 systolic murmur at the apex. RESPIRATORY:  Clear to auscultation without rales, wheezing or rhonchi  ABDOMEN: Soft, non-tender, non-distended MUSCULOSKELETAL:  No edema; No deformity  SKIN: Warm and dry NEUROLOGIC:  Alert and oriented x 3 PSYCHIATRIC:  Normal affect   Signed, Jenean Lindau, MD  03/02/2022 4:17 PM     [facility]

## 2022-03-03 ENCOUNTER — Telehealth: Payer: Self-pay | Admitting: Cardiology

## 2022-03-03 LAB — HEPATIC FUNCTION PANEL
ALT: 6 IU/L (ref 0–32)
AST: 13 IU/L (ref 0–40)
Albumin: 4.5 g/dL (ref 3.9–4.9)
Alkaline Phosphatase: 93 IU/L (ref 44–121)
Bilirubin Total: 0.3 mg/dL (ref 0.0–1.2)
Bilirubin, Direct: 0.11 mg/dL (ref 0.00–0.40)
Total Protein: 6.7 g/dL (ref 6.0–8.5)

## 2022-03-03 LAB — BASIC METABOLIC PANEL
BUN/Creatinine Ratio: 21 (ref 12–28)
BUN: 19 mg/dL (ref 8–27)
CO2: 22 mmol/L (ref 20–29)
Calcium: 9.7 mg/dL (ref 8.7–10.3)
Chloride: 103 mmol/L (ref 96–106)
Creatinine, Ser: 0.89 mg/dL (ref 0.57–1.00)
Glucose: 96 mg/dL (ref 70–99)
Potassium: 4.6 mmol/L (ref 3.5–5.2)
Sodium: 140 mmol/L (ref 134–144)
eGFR: 73 mL/min/{1.73_m2} (ref >59)

## 2022-03-03 NOTE — Telephone Encounter (Signed)
Pt would like a callback regarding EKG results. Please advise 

## 2022-03-03 NOTE — Telephone Encounter (Signed)
Pt returning call. Transferred to Leodis Liverpool, RN.

## 2022-03-03 NOTE — Telephone Encounter (Signed)
Revankar, Reita Cliche, MD  You 19 minutes ago (11:27 AM)    EKG was fine.  Has extra beats.  Because of body habitus it sometimes reports that poor anterior forces which is a Hotel manager description.   Patient notified.

## 2022-03-03 NOTE — Telephone Encounter (Signed)
I spoke with patient.  She saw EKG results in my chart and is asking for clarification.  I told her EKG was reviewed by Dr Geraldo Pitter and that it showed sinus rhythm with nonspecific ST-T changes.  I assured her no acute findings were seen on EKG and that EKG in my chart shows comments generated when EKG was done.  She is asking if Dr Geraldo Pitter could review and provide clarification. Patient reports it is OK to send comments through my chart also. Lab results from 03/02/22 reviewed with patient.

## 2022-03-03 NOTE — Telephone Encounter (Signed)
Left message to call office

## 2022-03-06 ENCOUNTER — Telehealth: Payer: Self-pay

## 2022-03-06 NOTE — Telephone Encounter (Signed)
Shelby Barnes Shelby Barnes - Rx #: [id number] sent to plan 03/06/22.

## 2022-03-08 NOTE — Telephone Encounter (Signed)
PA denied for Nexletol.

## 2022-03-31 DIAGNOSIS — F339 Major depressive disorder, recurrent, unspecified: Secondary | ICD-10-CM | POA: Diagnosis not present

## 2022-03-31 DIAGNOSIS — F5101 Primary insomnia: Secondary | ICD-10-CM | POA: Diagnosis not present

## 2022-03-31 DIAGNOSIS — F411 Generalized anxiety disorder: Secondary | ICD-10-CM | POA: Diagnosis not present

## 2022-04-15 IMAGING — MG MM DIGITAL SCREENING BILAT W/ TOMO AND CAD
8 series · 8 of 24 positions shown · non-contrast
Comparison: Previous exam(s).

CLINICAL DATA: Screening.

EXAM:
DIGITAL SCREENING BILATERAL MAMMOGRAM WITH TOMOSYNTHESIS AND CAD
TECHNIQUE: Bilateral screening digital craniocaudal and mediolateral oblique
mammograms were obtained. Bilateral screening digital breast
tomosynthesis was performed. The images were evaluated with
computer-aided detection.

[R MLO synth-2D]
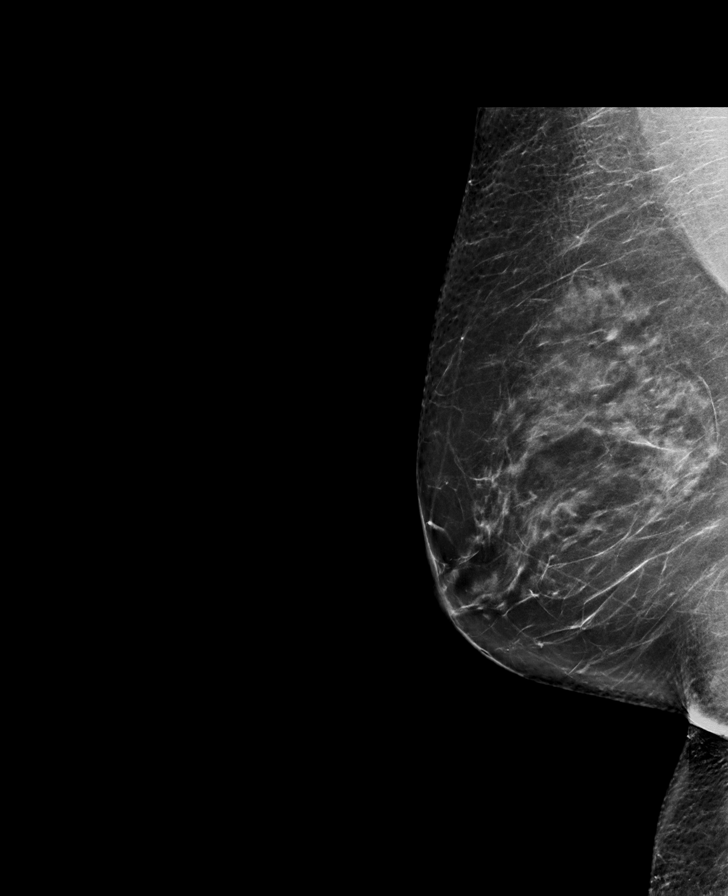

[R CC synth-2D]
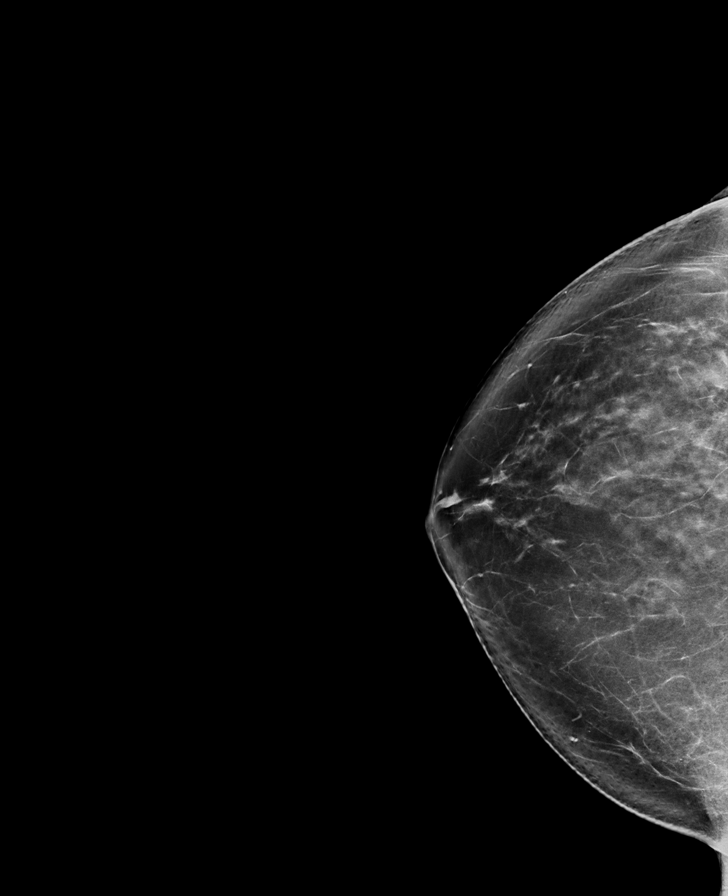

[L MLO synth-2D]
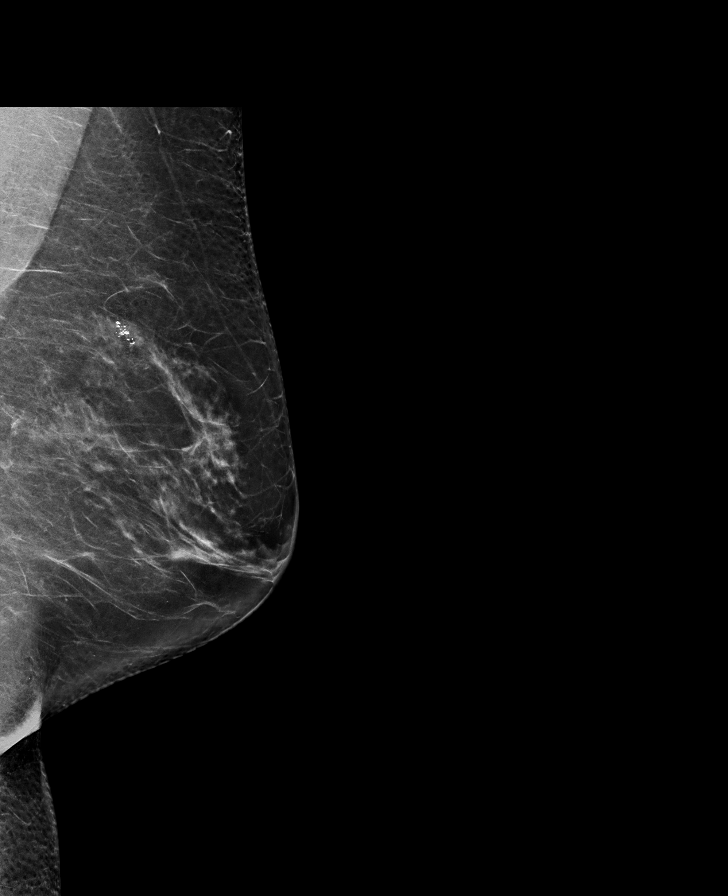

[L CC synth-2D]
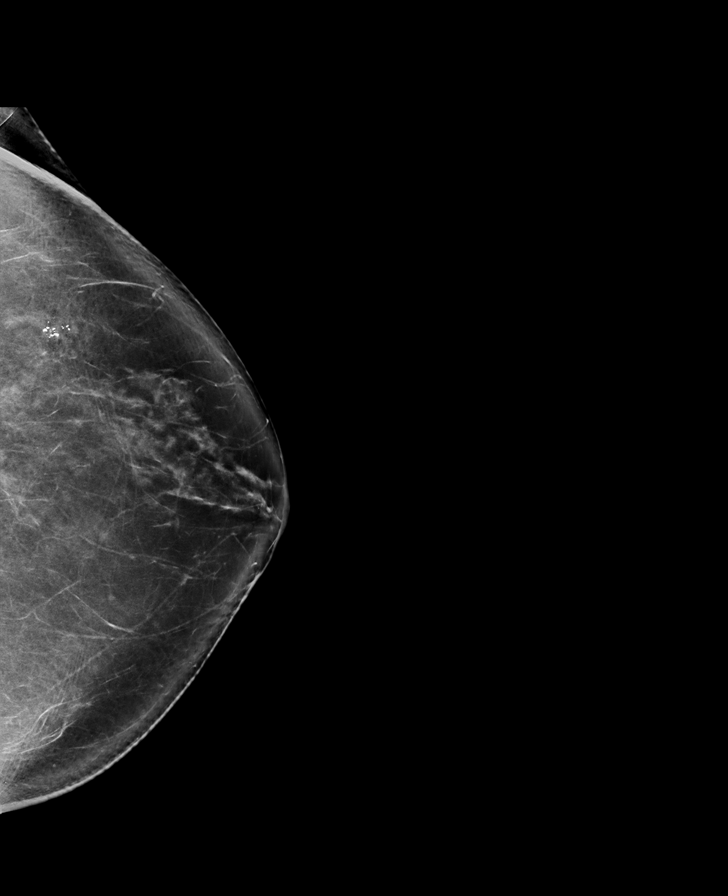

[L CC tomo · tomo slice 45/88.0]
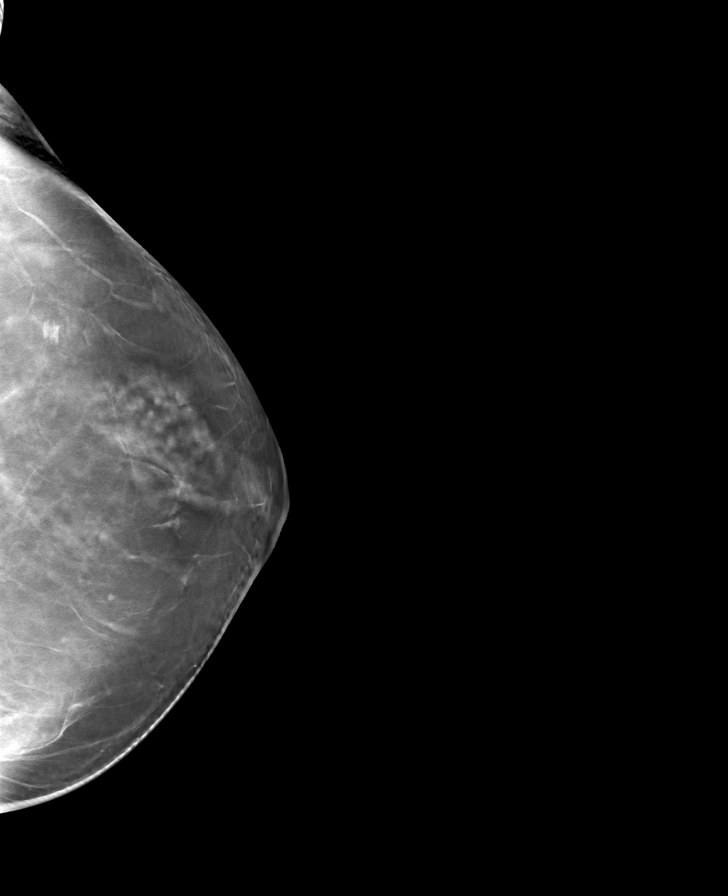

[L MLO tomo · tomo slice 41/80.0]
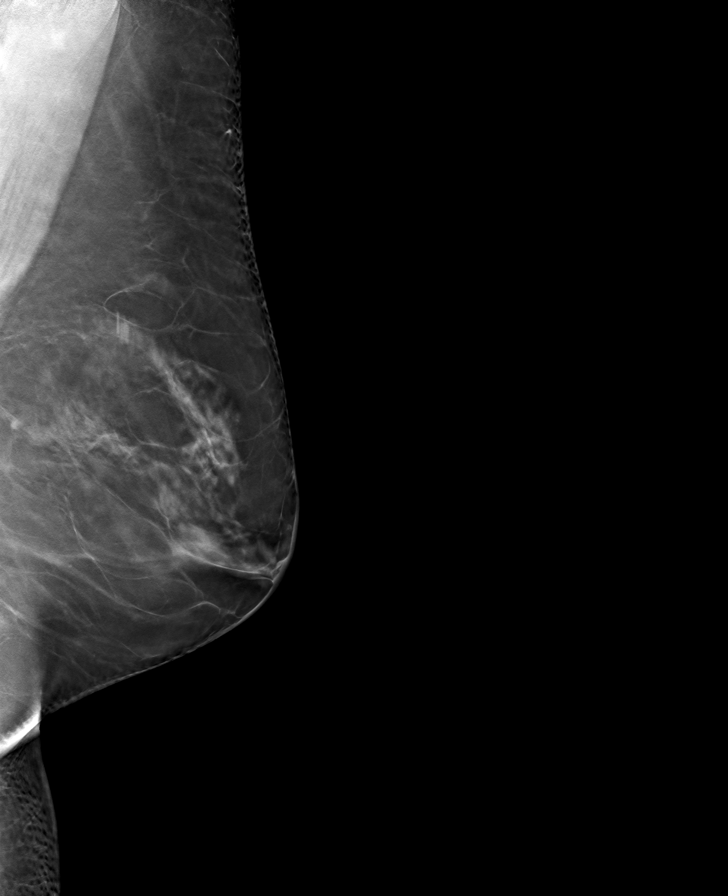

[R CC tomo · tomo slice 45/90.0]
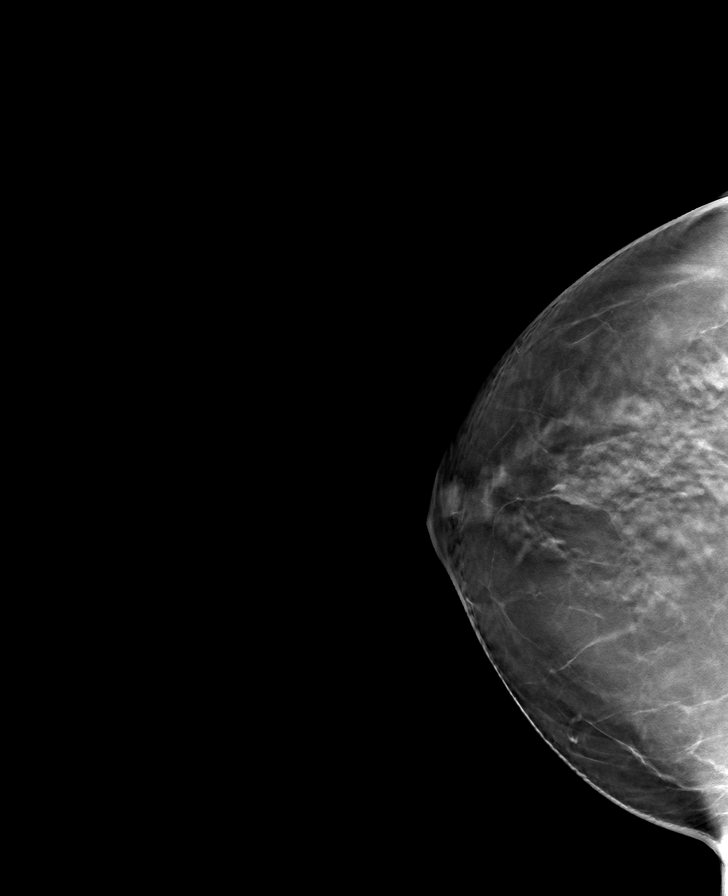

[R MLO tomo · tomo slice 44/87.0]
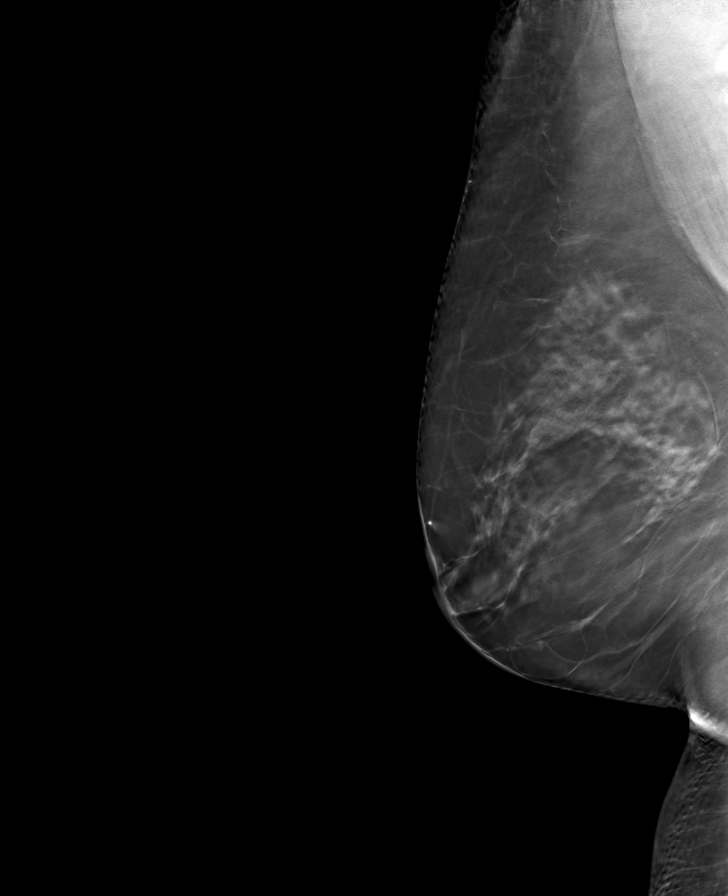

[8 of 24 positions shown; findings below may reference images not displayed]

ACR Breast Density Category b: There are scattered areas of
fibroglandular density.
FINDINGS: There are no findings suspicious for malignancy.
IMPRESSION: No mammographic evidence of malignancy. A result letter of this
screening mammogram will be mailed directly to the patient.

RECOMMENDATION:
Screening mammogram in one year. (Code:51-O-LD2)

BI-RADS CATEGORY  1: Negative.

## 2022-04-21 ENCOUNTER — Ambulatory Visit: Payer: BC Managed Care – PPO | Admitting: Family Medicine

## 2022-05-01 DIAGNOSIS — F339 Major depressive disorder, recurrent, unspecified: Secondary | ICD-10-CM | POA: Diagnosis not present

## 2022-05-01 DIAGNOSIS — F411 Generalized anxiety disorder: Secondary | ICD-10-CM | POA: Diagnosis not present

## 2022-05-01 DIAGNOSIS — F5101 Primary insomnia: Secondary | ICD-10-CM | POA: Diagnosis not present

## 2022-05-09 DIAGNOSIS — F322 Major depressive disorder, single episode, severe without psychotic features: Secondary | ICD-10-CM | POA: Diagnosis not present

## 2022-05-29 DIAGNOSIS — F5101 Primary insomnia: Secondary | ICD-10-CM | POA: Diagnosis not present

## 2022-05-29 DIAGNOSIS — F411 Generalized anxiety disorder: Secondary | ICD-10-CM | POA: Diagnosis not present

## 2022-05-29 DIAGNOSIS — F339 Major depressive disorder, recurrent, unspecified: Secondary | ICD-10-CM | POA: Diagnosis not present

## 2022-06-28 ENCOUNTER — Ambulatory Visit (INDEPENDENT_AMBULATORY_CARE_PROVIDER_SITE_OTHER): Payer: BC Managed Care – PPO | Admitting: Obstetrics and Gynecology

## 2022-06-28 ENCOUNTER — Encounter: Payer: Self-pay | Admitting: Obstetrics and Gynecology

## 2022-06-28 DIAGNOSIS — Z8619 Personal history of other infectious and parasitic diseases: Secondary | ICD-10-CM | POA: Diagnosis not present

## 2022-06-28 DIAGNOSIS — Z01419 Encounter for gynecological examination (general) (routine) without abnormal findings: Secondary | ICD-10-CM

## 2022-06-28 MED ORDER — ACYCLOVIR 400 MG PO TABS: ORAL_TABLET | ORAL | 4 refills | Status: DC

## 2022-06-28 NOTE — Progress Notes (Signed)
61 y.o. G0P0000 Divorced Caucasian female here for annual exam.    She is not taking hormonal treatment.   Taking Acyclovir and needs refill.   Struggling with weight loss.  Would like to loose 30 pounds. Tried Ozempic and Phentermine.   PCP:   Rochel Brome, MD  Patient's last menstrual period was 07/21/2014.           Sexually active: no The current method of family planning is post menopausal status.    Exercising: Yes.    Home exercise routine includes treadmill. Smoker:  no  Health Maintenance: Pap:  05-28-18 Neg:Neg HR HPV , 04-14-15 Neg:Neg HR HPV  History of abnormal Pap:  no MMG:  09/21/2021 BI-RADS CATEGORY  1: Negative.  Colonoscopy:  09/15/2021 - due in 10 years per patient. BMD:  11/30/21 osteopenia - FRAX:  7% overall risk and 0.4% risk hip fracture. TDaP:  PCP Gardasil:   no HIV:  neg in past Hep C: 2017 neg Screening Labs: PCP   reports that she has never smoked. She has never used smokeless tobacco. She reports current alcohol use of about 1.0 standard drink of alcohol per week. She reports that she does not use drugs.  Past Medical History:  Diagnosis Date   Allergy 09/01/2021   fall- seasonal   Anxiety    Binge eating    Class 1 obesity due to excess calories with serious comorbidity and body mass index (BMI) of 33.0 to 33.9 in adult 05/31/2021   Depression    Dysmenorrhea    Elevated coronary artery calcium score 03/17/2021   Family history of heart disease 01/09/2022   Fibroid    Genital HSV 1981   Hypercholesterolemia    Hyperlipidemia 03/17/2021   Insomnia    Kidney stones 2001   Low back pain    Major depressive disorder, single episode, unspecified 03/01/2022   Myalgia due to statin 01/09/2022   Other constipation 01/09/2022   Pelvic pain    Prediabetes 05/31/2021   Sinusitis    STD (sexually transmitted disease) 1981   herpes    Past Surgical History:  Procedure Laterality Date   BREAST EXCISIONAL BIOPSY Left 1998   Fibroadenoma   BREAST  SURGERY Left 09/1996   fibroadenema   COLONOSCOPY  05/2011    Current Outpatient Medications  Medication Sig Dispense Refill   acyclovir (ZOVIRAX) 400 MG tablet Two tablet daily for HSV suppression 180 tablet 4   ALPRAZolam (XANAX) 1 MG tablet Take 1 mg by mouth at bedtime as needed for anxiety.     AMBIEN 10 MG tablet Take 5-10 mg by mouth at bedtime as needed for sleep.     aspirin 81 MG chewable tablet Chew 81 mg by mouth daily.     B Complex Vitamins (VITAMIN-B COMPLEX PO) Take by mouth.     Cholecalciferol (D3 PO) Take 1 tablet by mouth daily.     co-enzyme Q-10 30 MG capsule Take 30 mg by mouth 3 (three) times daily.     doxepin (SINEQUAN) 10 MG capsule Take 10 mg by mouth at bedtime.     Multiple Vitamin (MULTIVITAMIN) capsule Take 1 capsule by mouth daily.     olopatadine (PATANOL) 0.1 % ophthalmic solution Place 1 drop into both eyes 2 (two) times daily. 5 mL 12   REPATHA SURECLICK 938 MG/ML SOAJ Inject 1 Syringe into the skin every 14 (fourteen) days.     Zinc Acetate, Oral, (ZINC ACETATE PO) Take 1 tablet by mouth daily.  Bempedoic Acid (NEXLETOL) 180 MG TABS Take 1 tablet by mouth daily in the afternoon. (Patient not taking: Reported on 06/28/2022) 30 tablet 12   phentermine (ADIPEX-P) 37.5 MG tablet Take 0.5 tablets (18.75 mg total) by mouth daily before breakfast. (Patient not taking: Reported on 06/28/2022) 30 tablet 0   progesterone (PROMETRIUM) 100 MG capsule TAKE 1 TO 2 CAPSULES BY MOUTH EVERY NIGHT AT BEDTIME (Patient not taking: Reported on 06/28/2022)     No current facility-administered medications for this visit.    Family History  Problem Relation Age of Onset   Diabetes Mother 53       Whipple Procedure from cyst on pancrease   Heart failure Father    Breast cancer Maternal Aunt 70       with metasis   Diabetes Maternal Grandmother    Stroke Paternal Grandmother    Heart failure Paternal Grandfather    Colon cancer Neg Hx    Colon polyps Neg Hx     Esophageal cancer Neg Hx    Rectal cancer Neg Hx    Stomach cancer Neg Hx     Review of Systems  All other systems reviewed and are negative.   Exam:   BP 124/80 (BP Location: Right Arm, Patient Position: Sitting, Cuff Size: Normal)   Pulse 84   Ht '5\' 3"'$  (1.6 m)   Wt 194 lb (88 kg)   LMP 07/21/2014   BMI 34.37 kg/m     General appearance: alert, cooperative and appears stated age Head: normocephalic, without obvious abnormality, atraumatic Neck: no adenopathy, supple, symmetrical, trachea midline and thyroid normal to inspection and palpation Lungs: clear to auscultation bilaterally Breasts: normal appearance, no masses or tenderness, No nipple retraction or dimpling, No nipple discharge or bleeding, No axillary adenopathy Heart: regular rate and rhythm Abdomen: soft, non-tender; no masses, no organomegaly Extremities: extremities normal, atraumatic, no cyanosis or edema Skin: skin color, texture, turgor normal. No rashes or lesions Lymph nodes: cervical, supraclavicular, and axillary nodes normal. Neurologic: grossly normal  Pelvic: External genitalia:  no lesions              No abnormal inguinal nodes palpated.              Urethra:  normal appearing urethra with no masses, tenderness or lesions              Bartholins and Skenes: normal                 Vagina: normal appearing vagina with normal color and discharge, no lesions              Cervix: no lesions              Pap taken: no Bimanual Exam:  Uterus:  normal size, contour, position, consistency, mobility, non-tender              Adnexa: no mass, fullness, tenderness              Rectal exam: yes.  Confirms.              Anus:  normal sphincter tone, no lesions  Chaperone was present for exam:  Kimalexis, RNA  Assessment:   Well woman visit with gynecologic exam. Off HRT. Hx HSV.  Osteopenia. Difficulty losing weight.   Plan: Mammogram screening discussed. Self breast awareness reviewed. Pap and HR HPV  2024 Guidelines for Calcium, Vitamin D, regular exercise program including cardiovascular and weight bearing exercise. Refill of Acyclovir  for one year.   BMD in 2025.  I suggested patient contact her PCP about referral to a weight loss clinic.  Follow up annually and prn.   After visit summary provided.

## 2022-06-28 NOTE — Patient Instructions (Signed)

## 2022-07-06 DIAGNOSIS — L814 Other melanin hyperpigmentation: Secondary | ICD-10-CM | POA: Diagnosis not present

## 2022-07-06 DIAGNOSIS — D225 Melanocytic nevi of trunk: Secondary | ICD-10-CM | POA: Diagnosis not present

## 2022-07-06 DIAGNOSIS — R234 Changes in skin texture: Secondary | ICD-10-CM | POA: Diagnosis not present

## 2022-07-06 DIAGNOSIS — L821 Other seborrheic keratosis: Secondary | ICD-10-CM | POA: Diagnosis not present

## 2022-07-19 ENCOUNTER — Ambulatory Visit: Payer: BC Managed Care – PPO | Admitting: Nurse Practitioner

## 2022-07-19 ENCOUNTER — Encounter: Payer: Self-pay | Admitting: Nurse Practitioner

## 2022-07-19 VITALS — BP 124/76 | HR 72 | Temp 97.2°F | Wt 193.0 lb

## 2022-07-19 DIAGNOSIS — J018 Other acute sinusitis: Secondary | ICD-10-CM

## 2022-07-19 DIAGNOSIS — H6121 Impacted cerumen, right ear: Secondary | ICD-10-CM

## 2022-07-19 DIAGNOSIS — R051 Acute cough: Secondary | ICD-10-CM | POA: Diagnosis not present

## 2022-07-19 LAB — POC COVID19 BINAXNOW: SARS Coronavirus 2 Ag: NEGATIVE

## 2022-07-19 MED ORDER — PROMETHAZINE-DM 6.25-15 MG/5ML PO SYRP: 5.0000 mL | ORAL_SOLUTION | Freq: Four times a day (QID) | ORAL | 0 refills | Status: DC | PRN

## 2022-07-19 MED ORDER — AZELASTINE HCL 0.1 % NA SOLN: 1.0000 | Freq: Two times a day (BID) | NASAL | 12 refills | Status: DC

## 2022-07-19 MED ORDER — TRIAMCINOLONE ACETONIDE 40 MG/ML IJ SUSP
60.0000 mg | Freq: Once | INTRAMUSCULAR | Status: AC
  Administered 2022-07-19: 60 mg via INTRAMUSCULAR

## 2022-07-19 MED ORDER — AZITHROMYCIN 250 MG PO TABS: ORAL_TABLET | ORAL | 0 refills | Status: AC

## 2022-07-19 NOTE — Progress Notes (Signed)
Acute Office Visit  Subjective:    Patient ID: Shelby Barnes, female    DOB: 26-Jun-1961, 61 y.o.   MRN: [id number]  Chief Complaint  Patient presents with   URI    HPI: Patient is in today for Upper respiratory symptoms She complains of sinus congestion, non productive cough, post nasal drip, sinus pressure, sore throat, and tooth pain. Denies fever, chills, night sweats or weight loss. Onset of symptoms was a few days ago and staying constant.Treatment has included drinking plenty of fluids, Mucinex, Benadryl and Aleve. History of chronic allergic rhinitis.   Past Medical History:  Diagnosis Date   Allergy 09/01/2021   fall- seasonal   Anxiety    Binge eating    Class 1 obesity due to excess calories with serious comorbidity and body mass index (BMI) of 33.0 to 33.9 in adult 05/31/2021   Depression    Dysmenorrhea    Elevated coronary artery calcium score 03/17/2021   Family history of heart disease 01/09/2022   Fibroid    Genital HSV 1981   Hypercholesterolemia    Hyperlipidemia 03/17/2021   Insomnia    Kidney stones 2001   Low back pain    Major depressive disorder, single episode, unspecified 03/01/2022   Myalgia due to statin 01/09/2022   Other constipation 01/09/2022   Pelvic pain    Prediabetes 05/31/2021   Sinusitis    STD (sexually transmitted disease) 1981   herpes    Past Surgical History:  Procedure Laterality Date   BREAST EXCISIONAL BIOPSY Left 1998   Fibroadenoma   BREAST SURGERY Left 09/1996   fibroadenema   COLONOSCOPY  05/2011    Family History  Problem Relation Age of Onset   Diabetes Mother 27       Whipple Procedure from cyst on pancrease   Heart failure Father    Breast cancer Maternal Aunt 70       with metasis   Diabetes Maternal Grandmother    Stroke Paternal Grandmother    Heart failure Paternal Grandfather    Colon cancer Neg Hx    Colon polyps Neg Hx    Esophageal cancer Neg Hx    Rectal cancer Neg Hx    Stomach cancer Neg  Hx     Social History   Socioeconomic History   Marital status: Divorced    Spouse name: Not on file   Number of children: 0   Years of education: Not on file   Highest education level: Not on file  Occupational History   Occupation: Office manager  Tobacco Use   Smoking status: Never   Smokeless tobacco: Never  Vaping Use   Vaping Use: Never used  Substance and Sexual Activity   Alcohol use: Yes    Alcohol/week: 1.0 standard drink of alcohol    Types: 1 Standard drinks or equivalent per week    Comment: 2 drinks per month   Drug use: Never   Sexual activity: Not Currently    Partners: Male    Birth control/protection: Post-menopausal  Other Topics Concern   Not on file  Social History Narrative   Not on file   Social Determinants of Health   Financial Resource Strain: Not on file  Food Insecurity: Not on file  Transportation Needs: Not on file  Physical Activity: Not on file  Stress: Not on file  Social Connections: Not on file  Intimate Partner Violence: Not on file    Outpatient Medications Prior to Visit  Medication Sig  Dispense Refill   acyclovir (ZOVIRAX) 400 MG tablet Two tablet daily for HSV suppression 180 tablet 4   ALPRAZolam (XANAX) 1 MG tablet Take 1 mg by mouth at bedtime as needed for anxiety.     AMBIEN 10 MG tablet Take 5-10 mg by mouth at bedtime as needed for sleep.     aspirin 81 MG chewable tablet Chew 81 mg by mouth daily.     B Complex Vitamins (VITAMIN-B COMPLEX PO) Take by mouth.     Bempedoic Acid (NEXLETOL) 180 MG TABS Take 1 tablet by mouth daily in the afternoon. (Patient not taking: Reported on 06/28/2022) 30 tablet 12   Cholecalciferol (D3 PO) Take 1 tablet by mouth daily.     co-enzyme Q-10 30 MG capsule Take 30 mg by mouth 3 (three) times daily.     doxepin (SINEQUAN) 10 MG capsule Take 10 mg by mouth at bedtime.     Multiple Vitamin (MULTIVITAMIN) capsule Take 1 capsule by mouth daily.     olopatadine (PATANOL) 0.1 %  ophthalmic solution Place 1 drop into both eyes 2 (two) times daily. 5 mL 12   phentermine (ADIPEX-P) 37.5 MG tablet Take 0.5 tablets (18.75 mg total) by mouth daily before breakfast. (Patient not taking: Reported on 06/28/2022) 30 tablet 0   progesterone (PROMETRIUM) 100 MG capsule TAKE 1 TO 2 CAPSULES BY MOUTH EVERY NIGHT AT BEDTIME (Patient not taking: Reported on 06/28/2022)     REPATHA SURECLICK 220 MG/ML SOAJ Inject 1 Syringe into the skin every 14 (fourteen) days.     Zinc Acetate, Oral, (ZINC ACETATE PO) Take 1 tablet by mouth daily.     No facility-administered medications prior to visit.    Allergies  Allergen Reactions   Lipitor [Atorvastatin] Other (See Comments)    Myalgias   Amoxicillin-Pot Clavulanate Diarrhea and Other (See Comments)   Codeine Other (See Comments)   Crestor [Rosuvastatin]     Body aches    Hydrocodone Nausea And Vomiting   Zetia [Ezetimibe]     Muscle pain.     Review of Systems See pertinent positives and negatives per HPI.     Objective:    Physical Exam Vitals reviewed.  Constitutional:      Appearance: She is ill-appearing.  HENT:     Right Ear: There is impacted cerumen.     Left Ear: Tympanic membrane normal.     Nose: Congestion and rhinorrhea present.     Mouth/Throat:     Pharynx: Posterior oropharyngeal erythema present.  Cardiovascular:     Rate and Rhythm: Normal rate and regular rhythm.     Pulses: Normal pulses.     Heart sounds: Normal heart sounds.  Pulmonary:     Effort: Pulmonary effort is normal.     Breath sounds: Normal breath sounds.  Neurological:     Mental Status: She is alert.     BP 124/76   Pulse 72   Temp (!) 97.2 F (36.2 C)   Wt 193 lb (87.5 kg)   LMP 07/21/2014   SpO2 99%   BMI 34.19 kg/m   Wt Readings from Last 3 Encounters:  07/19/22 193 lb (87.5 kg)  06/28/22 194 lb (88 kg)  03/02/22 190 lb 12.8 oz (86.5 kg)    Health Maintenance Due  Topic Date Due   INFLUENZA VACCINE  Never done     Lab Results  Component Value Date   TSH 1.020 01/12/2021   Lab Results  Component Value Date  WBC 5.4 01/09/2022   HGB 14.5 01/09/2022   HCT 42.1 01/09/2022   MCV 96 01/09/2022   PLT 248 01/09/2022   Lab Results  Component Value Date   NA 140 03/02/2022   K 4.6 03/02/2022   CO2 22 03/02/2022   GLUCOSE 96 03/02/2022   BUN 19 03/02/2022   CREATININE 0.89 03/02/2022   BILITOT 0.3 03/02/2022   ALKPHOS 93 03/02/2022   AST 13 03/02/2022   ALT 6 03/02/2022   PROT 6.7 03/02/2022   ALBUMIN 4.5 03/02/2022   CALCIUM 9.7 03/02/2022   EGFR 73 03/02/2022   Lab Results  Component Value Date   CHOL 232 (H) 01/09/2022   Lab Results  Component Value Date   HDL 68 01/09/2022   Lab Results  Component Value Date   LDLCALC 147 (H) 01/09/2022   Lab Results  Component Value Date   TRIG 97 01/09/2022   Lab Results  Component Value Date   CHOLHDL 3.4 01/09/2022   Lab Results  Component Value Date   HGBA1C 5.8 (H) 01/09/2022       Assessment & Plan:   1. Acute non-recurrent sinusitis of other sinus - azelastine (ASTELIN) 0.1 % nasal spray; Place 1 spray into both nostrils 2 (two) times daily. Use in each nostril as directed  Dispense: 30 mL; Refill: 12 - azithromycin (ZITHROMAX) 250 MG tablet; Take 2 tablets on day 1, then 1 tablet daily on days 2 through 5  Dispense: 6 tablet; Refill: 0 - promethazine-dextromethorphan (PROMETHAZINE-DM) 6.25-15 MG/5ML syrup; Take 5 mLs by mouth 4 (four) times daily as needed.  Dispense: 118 mL; Refill: 0 - triamcinolone acetonide (KENALOG-40) injection 60 mg  2. Acute cough - POC COVID-19 BinaxNow - promethazine-dextromethorphan (PROMETHAZINE-DM) 6.25-15 MG/5ML syrup; Take 5 mLs by mouth 4 (four) times daily as needed.  Dispense: 118 mL; Refill: 0  3. Right ear impacted cerumen - Ear wax removal       Take Zpack as directed Use Astelin nasal spray daily Take Promethazine-DM as needed for congestion Rest and push  fluids Follow-up as needed  Follow-up: PRN  An After Visit Summary was printed and given to the patient.  I, Rip Harbour, NP, have reviewed all documentation for this visit. The documentation on 07/19/22 for the exam, diagnosis, procedures, and orders are all accurate and complete.   Signed, Rip Harbour, NP [place] [phone]

## 2022-07-19 NOTE — Patient Instructions (Signed)
Take Zpack as directed Use Astelin nasal spray daily Take Promethazine-DM as needed for congestion Rest and push fluids Follow-up as needed   Sinus Infection, Adult A sinus infection is soreness and swelling (inflammation) of your sinuses. Sinuses are hollow spaces in the bones around your face. They are located: Around your eyes. In the middle of your forehead. Behind your nose. In your cheekbones. Your sinuses and nasal passages are lined with a fluid called mucus. Mucus drains out of your sinuses. Swelling can trap mucus in your sinuses. This lets germs (bacteria, virus, or fungus) grow, which leads to infection. Most of the time, this condition is caused by a virus. What are the causes? Allergies. Asthma. Germs. Things that block your nose or sinuses. Growths in the nose (nasal polyps). Chemicals or irritants in the air. A fungus. This is rare. What increases the risk? Having a weak body defense system (immune system). Doing a lot of swimming or diving. Using nasal sprays too much. Smoking. What are the signs or symptoms? The main symptoms of this condition are pain and a feeling of pressure around the sinuses. Other symptoms include: Stuffy nose (congestion). This may make it hard to breathe through your nose. Runny nose (drainage). Soreness, swelling, and warmth in the sinuses. A cough that may get worse at night. Being unable to smell and taste. Mucus that collects in the throat or the back of the nose (postnasal drip). This may cause a sore throat or bad breath. Being very tired (fatigued). A fever. How is this diagnosed? Your symptoms. Your medical history. A physical exam. Tests to find out if your condition is short-term (acute) or long-term (chronic). Your doctor may: Check your nose for growths (polyps). Check your sinuses using a tool that has a light on one end (endoscope). Check for allergies or germs. Do imaging tests, such as an MRI or CT scan. How is  this treated? Treatment for this condition depends on the cause and whether it is short-term or long-term. If caused by a virus, your symptoms should go away on their own within 10 days. You may be given medicines to relieve symptoms. They include: Medicines that shrink swollen tissue in the nose. A spray that treats swelling of the nostrils. Rinses that help get rid of thick mucus in your nose (nasal saline washes). Medicines that treat allergies (antihistamines). Over-the-counter pain relievers. If caused by bacteria, your doctor may wait to see if you will get better without treatment. You may be given antibiotic medicine if you have: A very bad infection. A weak body defense system. If caused by growths in the nose, surgery may be needed. Follow these instructions at home: Medicines Take, use, or apply over-the-counter and prescription medicines only as told by your doctor. These may include nasal sprays. If you were prescribed an antibiotic medicine, take it as told by your doctor. Do not stop taking it even if you start to feel better. Hydrate and humidify  Drink enough water to keep your pee (urine) pale yellow. Use a cool mist humidifier to keep the humidity level in your home above 50%. Breathe in steam for 10-15 minutes, 3-4 times a day, or as told by your doctor. You can do this in the bathroom while a hot shower is running. Try not to spend time in cool or dry air. Rest Rest as much as you can. Sleep with your head raised (elevated). Make sure you get enough sleep each night. General instructions  Put a warm,  moist washcloth on your face 3-4 times a day, or as often as told by your doctor. Use nasal saline washes as often as told by your doctor. Wash your hands often with soap and water. If you cannot use soap and water, use hand sanitizer. Do not smoke. Avoid being around people who are smoking (secondhand smoke). Keep all follow-up visits. Contact a doctor if: You have  a fever. Your symptoms get worse. Your symptoms do not get better within 10 days. Get help right away if: You have a very bad headache. You cannot stop vomiting. You have very bad pain or swelling around your face or eyes. You have trouble seeing. You feel confused. Your neck is stiff. You have trouble breathing. These symptoms may be an emergency. Get help right away. Call 911. Do not wait to see if the symptoms will go away. Do not drive yourself to the hospital. Summary A sinus infection is swelling of your sinuses. Sinuses are hollow spaces in the bones around your face. This condition is caused by tissues in your nose that become inflamed or swollen. This traps germs. These can lead to infection. If you were prescribed an antibiotic medicine, take it as told by your doctor. Do not stop taking it even if you start to feel better. Keep all follow-up visits. This information is not intended to replace advice given to you by your health care provider. Make sure you discuss any questions you have with your health care provider. Document Revised: 07/12/2021 Document Reviewed: 07/12/2021 Elsevier Patient Education  [place].

## 2022-07-22 ENCOUNTER — Other Ambulatory Visit: Payer: Self-pay | Admitting: Obstetrics and Gynecology

## 2022-07-22 DIAGNOSIS — Z8619 Personal history of other infectious and parasitic diseases: Secondary | ICD-10-CM

## 2022-07-24 NOTE — Telephone Encounter (Signed)
Med refill request:acyclovir 400 mg tab, take 2 tabs daily PO for suppression.   Rx sent 06/28/22 #180/4RF Rx refused   Routing to provider for final review. . Will close encounter.

## 2022-07-25 ENCOUNTER — Other Ambulatory Visit: Payer: Self-pay

## 2022-07-26 MED ORDER — REPATHA SURECLICK 140 MG/ML ~~LOC~~ SOAJ: 140.0000 mg | SUBCUTANEOUS | 2 refills | Status: DC

## 2022-07-27 ENCOUNTER — Other Ambulatory Visit: Payer: Self-pay | Admitting: Nurse Practitioner

## 2022-07-27 ENCOUNTER — Telehealth: Payer: Self-pay

## 2022-07-27 DIAGNOSIS — J018 Other acute sinusitis: Secondary | ICD-10-CM

## 2022-07-27 MED ORDER — DOXYCYCLINE HYCLATE 100 MG PO TABS: 100.0000 mg | ORAL_TABLET | Freq: Two times a day (BID) | ORAL | 0 refills | Status: DC

## 2022-07-27 NOTE — Telephone Encounter (Signed)
Patient was seen 11/29 with URI symptoms and was treated with '60mg'$  Kenalog injection, a Z pack, and promethazine-DM.  She completed her Z pack Monday and states that she is not any better.  She is requesting something more medication.

## 2022-07-28 ENCOUNTER — Other Ambulatory Visit: Payer: Self-pay

## 2022-08-09 ENCOUNTER — Other Ambulatory Visit: Payer: Self-pay | Admitting: Family Medicine

## 2022-08-10 DIAGNOSIS — F411 Generalized anxiety disorder: Secondary | ICD-10-CM | POA: Diagnosis not present

## 2022-08-10 DIAGNOSIS — F339 Major depressive disorder, recurrent, unspecified: Secondary | ICD-10-CM | POA: Diagnosis not present

## 2022-08-10 DIAGNOSIS — F5101 Primary insomnia: Secondary | ICD-10-CM | POA: Diagnosis not present

## 2022-08-18 ENCOUNTER — Encounter: Payer: Self-pay | Admitting: Nurse Practitioner

## 2022-08-18 ENCOUNTER — Telehealth (INDEPENDENT_AMBULATORY_CARE_PROVIDER_SITE_OTHER): Payer: BC Managed Care – PPO | Admitting: Nurse Practitioner

## 2022-08-18 ENCOUNTER — Telehealth: Payer: BC Managed Care – PPO | Admitting: Family Medicine

## 2022-08-18 ENCOUNTER — Telehealth: Payer: Self-pay

## 2022-08-18 VITALS — BP 148/86 | HR 114 | Temp 97.5°F

## 2022-08-18 DIAGNOSIS — R7981 Abnormal blood-gas level: Secondary | ICD-10-CM

## 2022-08-18 DIAGNOSIS — U071 COVID-19: Secondary | ICD-10-CM | POA: Insufficient documentation

## 2022-08-18 DIAGNOSIS — J069 Acute upper respiratory infection, unspecified: Secondary | ICD-10-CM | POA: Insufficient documentation

## 2022-08-18 MED ORDER — NIRMATRELVIR/RITONAVIR (PAXLOVID)TABLET: 3.0000 | ORAL_TABLET | Freq: Two times a day (BID) | ORAL | 0 refills | Status: AC

## 2022-08-18 NOTE — Telephone Encounter (Signed)
Patient called this morning an spoke with Misty. Patient stated that she took a home COVID test that was positive. Patient notified that I was informed by Dr. Tobie Poet first thing this morning for patients to go to UC or to do a e-visit through [facility]. Patient was notified that due to no openings today with any of our providers, she will need to go to UC to be seen or she can do an e-visit with a [facility] provider. Patient asked me what does the e-visit provider know about her, I told her it is somewhat like an UC but it would be virtual. . Patient stated that her friends doctor just called something in for her, and she thought she would be able to do the same. Patient stated ok, I don't know

## 2022-08-18 NOTE — Assessment & Plan Note (Signed)
Finish the course of Paxlovid Use Flonase nasal spray daily Take Promethazine-DM up to 4 times daily for cough/sinus congestion Drink plenty of fluids Follow-up as needed

## 2022-08-18 NOTE — Progress Notes (Signed)
Based on what you shared with me, I feel your condition warrants further evaluation as soon as possible at an Emergency department.   COVID + with low oxygen levels   NOTE: There will be NO CHARGE for this eVisit

## 2022-08-18 NOTE — Progress Notes (Signed)
Virtual Visit via Video Note   This visit type was conducted due to national recommendations for restrictions regarding the COVID-19 Pandemic (e.g. social distancing) in an effort to limit this patient's exposure and mitigate transmission in our community.  Due to her co-morbid illnesses, this patient is at least at moderate risk for complications without adequate follow up.  This format is felt to be most appropriate for this patient at this time.  All issues noted in this document were discussed and addressed.  A limited physical exam was performed with this format.  A verbal consent was obtained for the virtual visit.   Date:  08/18/2022   ID:  Shelby Barnes, DOB March 27, 1961, MRN [id number]  Patient Location: Home Provider Location: Office/Clinic  PCP:  Rochel Brome, MD   Evaluation Performed:  Follow-Up Visit  Chief Complaint:  patient   History of Present Illness:    Shelby Barnes is a 61 y.o. female with symptoms concerning for COVID-19 infection (fever, chills, cough, or new shortness of breath).  Patient exposed to neighbor who have a Covid.Patient tested for Covid positive last night. She denies Fever, but complaints chills, cough, sore throat, congestion and headache since 3 days.  Past Medical History:  Diagnosis Date   Allergy 09/01/2021   fall- seasonal   Anxiety    Binge eating    Class 1 obesity due to excess calories with serious comorbidity and body mass index (BMI) of 33.0 to 33.9 in adult 05/31/2021   Depression    Dysmenorrhea    Elevated coronary artery calcium score 03/17/2021   Family history of heart disease 01/09/2022   Fibroid    Genital HSV 1981   Hypercholesterolemia    Hyperlipidemia 03/17/2021   Insomnia    Kidney stones 2001   Low back pain    Major depressive disorder, single episode, unspecified 03/01/2022   Myalgia due to statin 01/09/2022   Other constipation 01/09/2022   Pelvic pain    Prediabetes 05/31/2021   Sinusitis    STD  (sexually transmitted disease) 1981   herpes    Past Surgical History:  Procedure Laterality Date   BREAST EXCISIONAL BIOPSY Left 1998   Fibroadenoma   BREAST SURGERY Left 09/1996   fibroadenema   COLONOSCOPY  05/2011    Family History  Problem Relation Age of Onset   Diabetes Mother 59       Whipple Procedure from cyst on pancrease   Heart failure Father    Breast cancer Maternal Aunt 70       with metasis   Diabetes Maternal Grandmother    Stroke Paternal Grandmother    Heart failure Paternal Grandfather    Colon cancer Neg Hx    Colon polyps Neg Hx    Esophageal cancer Neg Hx    Rectal cancer Neg Hx    Stomach cancer Neg Hx     Social History   Socioeconomic History   Marital status: Divorced    Spouse name: Not on file   Number of children: 0   Years of education: Not on file   Highest education level: Not on file  Occupational History   Occupation: Office manager  Tobacco Use   Smoking status: Never   Smokeless tobacco: Never  Vaping Use   Vaping Use: Never used  Substance and Sexual Activity   Alcohol use: Yes    Alcohol/week: 1.0 standard drink of alcohol    Types: 1 Standard drinks or equivalent per week  Comment: 2 drinks per month   Drug use: Never   Sexual activity: Not Currently    Partners: Male    Birth control/protection: Post-menopausal  Other Topics Concern   Not on file  Social History Narrative   Not on file   Social Determinants of Health   Financial Resource Strain: Not on file  Food Insecurity: Not on file  Transportation Needs: Not on file  Physical Activity: Not on file  Stress: Not on file  Social Connections: Not on file  Intimate Partner Violence: Not on file    Outpatient Medications Prior to Visit  Medication Sig Dispense Refill   acyclovir (ZOVIRAX) 400 MG tablet Two tablet daily for HSV suppression 180 tablet 4   ALPRAZolam (XANAX) 1 MG tablet Take 1 mg by mouth at bedtime as needed for anxiety.      AMBIEN 10 MG tablet Take 5-10 mg by mouth at bedtime as needed for sleep.     aspirin 81 MG chewable tablet Chew 81 mg by mouth daily.     azelastine (ASTELIN) 0.1 % nasal spray Place 1 spray into both nostrils 2 (two) times daily. Use in each nostril as directed 30 mL 12   B Complex Vitamins (VITAMIN-B COMPLEX PO) Take by mouth.     Bempedoic Acid (NEXLETOL) 180 MG TABS Take 1 tablet by mouth daily in the afternoon. (Patient not taking: Reported on 06/28/2022) 30 tablet 12   Cholecalciferol (D3 PO) Take 1 tablet by mouth daily.     co-enzyme Q-10 30 MG capsule Take 30 mg by mouth 3 (three) times daily.     doxepin (SINEQUAN) 10 MG capsule Take 10 mg by mouth at bedtime.     doxycycline (VIBRA-TABS) 100 MG tablet Take 1 tablet (100 mg total) by mouth 2 (two) times daily. 14 tablet 0   Multiple Vitamin (MULTIVITAMIN) capsule Take 1 capsule by mouth daily.     olopatadine (PATANOL) 0.1 % ophthalmic solution Place 1 drop into both eyes 2 (two) times daily. 5 mL 12   phentermine (ADIPEX-P) 37.5 MG tablet Take 0.5 tablets (18.75 mg total) by mouth daily before breakfast. (Patient not taking: Reported on 06/28/2022) 30 tablet 0   progesterone (PROMETRIUM) 100 MG capsule TAKE 1 TO 2 CAPSULES BY MOUTH EVERY NIGHT AT BEDTIME (Patient not taking: Reported on 06/28/2022)     promethazine-dextromethorphan (PROMETHAZINE-DM) 6.25-15 MG/5ML syrup Take 5 mLs by mouth 4 (four) times daily as needed. 118 mL 0   REPATHA SURECLICK 562 MG/ML SOAJ Inject 140 mg into the skin every 14 (fourteen) days. 2 mL 2   Zinc Acetate, Oral, (ZINC ACETATE PO) Take 1 tablet by mouth daily.     No facility-administered medications prior to visit.    Allergies:   Lipitor [atorvastatin], Amoxicillin-pot clavulanate, Codeine, Crestor [rosuvastatin], Hydrocodone, and Zetia [ezetimibe]   Social History   Tobacco Use   Smoking status: Never   Smokeless tobacco: Never  Vaping Use   Vaping Use: Never used  Substance Use Topics    Alcohol use: Yes    Alcohol/week: 1.0 standard drink of alcohol    Types: 1 Standard drinks or equivalent per week    Comment: 2 drinks per month   Drug use: Never     Review of Systems  Constitutional:  Positive for chills and malaise/fatigue. Negative for fever.  HENT:  Positive for congestion, sinus pain and sore throat. Negative for hearing loss.   Eyes: Negative.   Respiratory:  Positive for cough.  Cardiovascular:  Negative for chest pain and palpitations.  Gastrointestinal:  Negative for abdominal pain, diarrhea, heartburn, nausea and vomiting.       Low appetite  Musculoskeletal:  Positive for myalgias. Negative for back pain and joint pain.  Neurological:  Positive for headaches. Negative for dizziness and weakness.     Labs/Other Tests and Data Reviewed:    Recent Labs: 01/09/2022: Hemoglobin 14.5; Platelets 248 03/02/2022: ALT 6; BUN 19; Creatinine, Ser 0.89; Potassium 4.6; Sodium 140   Recent Lipid Panel Lab Results  Component Value Date/Time   CHOL 232 (H) 01/09/2022 08:02 AM   TRIG 97 01/09/2022 08:02 AM   HDL 68 01/09/2022 08:02 AM   CHOLHDL 3.4 01/09/2022 08:02 AM   LDLCALC 147 (H) 01/09/2022 08:02 AM    Wt Readings from Last 3 Encounters:  07/19/22 193 lb (87.5 kg)  06/28/22 194 lb (88 kg)  03/02/22 190 lb 12.8 oz (86.5 kg)     Objective:    BP (!) 148/86 (BP Location: Right Arm, Patient Position: Sitting) Comment: virtual visit , pt self checked it  Pulse (!) 114   Temp (!) 97.5 F (36.4 C)   LMP 07/21/2014   SpO2 97%   Physical Exam Constitutional:      General: She is in acute distress.  Neurological:     Mental Status: She is oriented to person, place, and time.  Psychiatric:        Mood and Affect: Mood normal.        Thought Content: Thought content normal.        Judgment: Judgment normal.     ASSESSMENT & PLAN:    Problem List Items Addressed This Visit       Respiratory   Upper respiratory tract infection due to COVID-19  virus - Primary    Finish the course of Paxlovid Use Flonase nasal spray daily Take Promethazine-DM up to 4 times daily for cough/sinus congestion Drink plenty of fluids Follow-up as needed       Relevant Medications   nirmatrelvir/ritonavir (PAXLOVID) 20 x 150 MG & 10 x '100MG'$  TABS     I spent 15 minutes dedicated to the care of this patient on the date of this encounter to include face-to-face time with the patient, as well as managing her illness  Follow Up:  In Person prn  Signed, Neil Crouch, FNP  08/18/2022 12:18 PM    [facility], [place]

## 2022-09-21 DIAGNOSIS — R300 Dysuria: Secondary | ICD-10-CM | POA: Diagnosis not present

## 2022-09-21 DIAGNOSIS — R81 Glycosuria: Secondary | ICD-10-CM | POA: Diagnosis not present

## 2022-09-21 DIAGNOSIS — N390 Urinary tract infection, site not specified: Secondary | ICD-10-CM | POA: Diagnosis not present

## 2022-09-22 ENCOUNTER — Ambulatory Visit: Payer: BC Managed Care – PPO | Admitting: Physician Assistant

## 2022-09-29 DIAGNOSIS — F339 Major depressive disorder, recurrent, unspecified: Secondary | ICD-10-CM | POA: Diagnosis not present

## 2022-09-29 DIAGNOSIS — F5101 Primary insomnia: Secondary | ICD-10-CM | POA: Diagnosis not present

## 2022-09-29 DIAGNOSIS — F411 Generalized anxiety disorder: Secondary | ICD-10-CM | POA: Diagnosis not present

## 2022-10-06 ENCOUNTER — Encounter: Payer: Self-pay | Admitting: Family Medicine

## 2022-10-06 ENCOUNTER — Ambulatory Visit (INDEPENDENT_AMBULATORY_CARE_PROVIDER_SITE_OTHER): Payer: BC Managed Care – PPO | Admitting: Family Medicine

## 2022-10-06 ENCOUNTER — Other Ambulatory Visit: Payer: Self-pay | Admitting: Family Medicine

## 2022-10-06 VITALS — BP 134/84 | HR 77 | Temp 97.5°F | Ht 63.0 in | Wt 196.0 lb

## 2022-10-06 DIAGNOSIS — R3915 Urgency of urination: Secondary | ICD-10-CM | POA: Diagnosis not present

## 2022-10-06 DIAGNOSIS — R7303 Prediabetes: Secondary | ICD-10-CM | POA: Diagnosis not present

## 2022-10-06 DIAGNOSIS — Z0001 Encounter for general adult medical examination with abnormal findings: Secondary | ICD-10-CM

## 2022-10-06 DIAGNOSIS — Z1231 Encounter for screening mammogram for malignant neoplasm of breast: Secondary | ICD-10-CM | POA: Diagnosis not present

## 2022-10-06 DIAGNOSIS — E6609 Other obesity due to excess calories: Secondary | ICD-10-CM

## 2022-10-06 DIAGNOSIS — M791 Myalgia, unspecified site: Secondary | ICD-10-CM

## 2022-10-06 DIAGNOSIS — Z0000 Encounter for general adult medical examination without abnormal findings: Secondary | ICD-10-CM | POA: Diagnosis not present

## 2022-10-06 DIAGNOSIS — Z6834 Body mass index (BMI) 34.0-34.9, adult: Secondary | ICD-10-CM

## 2022-10-06 DIAGNOSIS — T466X5A Adverse effect of antihyperlipidemic and antiarteriosclerotic drugs, initial encounter: Secondary | ICD-10-CM

## 2022-10-06 DIAGNOSIS — E782 Mixed hyperlipidemia: Secondary | ICD-10-CM

## 2022-10-06 DIAGNOSIS — F330 Major depressive disorder, recurrent, mild: Secondary | ICD-10-CM

## 2022-10-06 LAB — POCT URINALYSIS DIPSTICK
Bilirubin, UA: NEGATIVE
Blood, UA: NEGATIVE
Glucose, UA: NEGATIVE
Ketones, UA: NEGATIVE
Leukocytes, UA: NEGATIVE
Nitrite, UA: NEGATIVE
Protein, UA: NEGATIVE
Spec Grav, UA: 1.01 (ref 1.010–1.025)
Urobilinogen, UA: NEGATIVE E.U./dL — AB
pH, UA: 6.5 (ref 5.0–8.0)

## 2022-10-06 MED ORDER — ZEPBOUND 2.5 MG/0.5ML ~~LOC~~ SOAJ: 2.5000 mg | SUBCUTANEOUS | 0 refills | Status: DC

## 2022-10-06 NOTE — Progress Notes (Unsigned)
Subjective:  Patient ID: Shelby Barnes, female    DOB: 1961/03/04  Age: 62 y.o. MRN: [id number]  Chief Complaint  Patient presents with   Annual Exam    HPI Well Adult Physical: Patient here for a comprehensive physical exam.The patient reports no problems Do you take any herbs or supplements that were not prescribed by a doctor? yes Are you taking calcium supplements? no Are you taking aspirin daily? no  Encounter for general adult medical examination without abnormal findings  Physical ("At Risk" items are starred): Patient's last physical exam was 1 year ago .  Patient is not afflicted from Stress Incontinence and Urge Incontinence  Patient wears a seat belts Patient has smoke detectors and has carbon monoxide detectors. Patient practices appropriate gun safety. Patient wears sunscreen with extended sun exposure. Dental Care: biannual cleanings, brushes and flosses daily. Ophthalmology/Optometry: Annual visit.  Hearing loss: none Vision impairments: none  Menarche: See's Gynecology Menstrual History: See's Gynecology LMP: See's Gynecology Pregnancy history: None Safe at home: Yes Self breast exams: Yes  Depression: patient is seeing psychiatry. On wellbutrin 75 mg once in am, ambien, xanax, and hydroxyzine.      10/06/2022   10:12 AM 05/19/2021    8:15 AM 12/13/2020    9:00 AM  Depression screen PHQ 2/9  Decreased Interest 1 1 0  Down, Depressed, Hopeless 1 1 0  PHQ - 2 Score 2 2 0  Altered sleeping 3 3   Tired, decreased energy 1 1   Change in appetite 0 0   Feeling bad or failure about yourself   1   Trouble concentrating 0 1   Moving slowly or fidgety/restless 0 0   Suicidal thoughts 0 0   PHQ-9 Score 6 8   Difficult doing work/chores Somewhat difficult Not difficult at all          12/13/2020    9:00 AM 10/06/2022   10:11 AM  Fall Risk  Falls in the past year? 0 0  Was there an injury with Fall? 0 0  Fall Risk Category Calculator 0 0  Fall Risk  Category (Retired) Low   (RETIRED) Patient Fall Risk Level Low fall risk   Patient at Risk for Falls Due to No Fall Risks No Fall Risks  Fall risk Follow up Falls evaluation completed Falls evaluation completed      Social Hx   Social History   Socioeconomic History   Marital status: Divorced    Spouse name: Not on file   Number of children: 0   Years of education: Not on file   Highest education level: Not on file  Occupational History   Occupation: Office manager  Tobacco Use   Smoking status: Never   Smokeless tobacco: Never  Vaping Use   Vaping Use: Never used  Substance and Sexual Activity   Alcohol use: Yes    Alcohol/week: 1.0 standard drink of alcohol    Types: 1 Standard drinks or equivalent per week    Comment: 2 drinks per month   Drug use: Never   Sexual activity: Not Currently    Partners: Male    Birth control/protection: Post-menopausal  Other Topics Concern   Not on file  Social History Narrative   Not on file   Social Determinants of Health   Financial Resource Strain: Low Risk  (10/06/2022)   Overall Financial Resource Strain (CARDIA)    Difficulty of Paying Living Expenses: Not hard at all  Food Insecurity: No Food Insecurity (  10/06/2022)   Hunger Vital Sign    Worried About Running Out of Food in the Last Year: Never true    [place] in the Last Year: Never true  Transportation Needs: No Transportation Needs (10/06/2022)   PRAPARE - Hydrologist (Medical): No    Lack of Transportation (Non-Medical): No  Physical Activity: Insufficiently Active (10/06/2022)   Exercise Vital Sign    Days of Exercise per Week: 3 days    Minutes of Exercise per Session: 20 min  Stress: No Stress Concern Present (10/06/2022)   [place]    Feeling of Stress : Not at all  Social Connections: Socially Isolated (10/06/2022)   Social Connection and Isolation  Panel [NHANES]    Frequency of Communication with Friends and Family: Three times a week    Frequency of Social Gatherings with Friends and Family: Never    Attends Religious Services: Never    Marine scientist or Organizations: No    Attends Music therapist: Never    Marital Status: Never married   Past Medical History:  Diagnosis Date   Allergy 09/01/2021   fall- seasonal   Anxiety    Binge eating    Class 1 obesity due to excess calories with serious comorbidity and body mass index (BMI) of 33.0 to 33.9 in adult 05/31/2021   Depression    Dysmenorrhea    Elevated coronary artery calcium score 03/17/2021   Family history of heart disease 01/09/2022   Fibroid    Genital HSV 1981   Hypercholesterolemia    Hyperlipidemia 03/17/2021   Insomnia    Kidney stones 2001   Low back pain    Major depressive disorder, single episode, unspecified 03/01/2022   Myalgia due to statin 01/09/2022   Other constipation 01/09/2022   Pelvic pain    Prediabetes 05/31/2021   Sinusitis    STD (sexually transmitted disease) 1981   herpes   Past Surgical History:  Procedure Laterality Date   BREAST EXCISIONAL BIOPSY Left 1998   Fibroadenoma   BREAST SURGERY Left 09/1996   fibroadenema   COLONOSCOPY  05/2011    Family History  Problem Relation Age of Onset   Diabetes Mother 63       Whipple Procedure from cyst on pancrease   Heart failure Father    Breast cancer Maternal Aunt 70       with metasis   Diabetes Maternal Grandmother    Stroke Paternal Grandmother    Heart failure Paternal Grandfather    Colon cancer Neg Hx    Colon polyps Neg Hx    Esophageal cancer Neg Hx    Rectal cancer Neg Hx    Stomach cancer Neg Hx     Review of Systems  Constitutional:  Negative for appetite change, fatigue and fever.  HENT:  Negative for congestion, ear pain, sinus pressure and sore throat.   Respiratory:  Negative for cough, chest tightness, shortness of breath and wheezing.    Cardiovascular:  Negative for chest pain and palpitations.  Gastrointestinal:  Negative for abdominal pain, constipation, diarrhea, nausea and vomiting.  Genitourinary:  Positive for urgency. Negative for dysuria and hematuria.  Musculoskeletal:  Negative for arthralgias, back pain, joint swelling and myalgias.  Skin:  Negative for rash.  Neurological:  Negative for dizziness, weakness and headaches.  Psychiatric/Behavioral:  Negative for dysphoric mood. The patient is not nervous/anxious.  Objective:  BP 134/84 (BP Location: Left Arm, Patient Position: Sitting)   Pulse 77   Temp (!) 97.5 F (36.4 C) (Temporal)   Ht 5' 3"$  (1.6 m)   Wt 196 lb (88.9 kg)   LMP 07/21/2014   SpO2 98%   BMI 34.72 kg/m      10/06/2022   10:27 AM 08/18/2022   12:08 PM 07/19/2022    9:36 AM  BP/Weight  Systolic BP [id number]  Diastolic BP 84 86 76  Wt. (Lbs) 196  193  BMI 34.72 kg/m2  34.19 kg/m2    Physical Exam Vitals reviewed.  Constitutional:      Appearance: Normal appearance. She is obese.  HENT:     Right Ear: Tympanic membrane, ear canal and external ear normal.     Left Ear: Tympanic membrane, ear canal and external ear normal.     Nose: Nose normal.     Mouth/Throat:     Pharynx: No oropharyngeal exudate or posterior oropharyngeal erythema.  Eyes:     Conjunctiva/sclera: Conjunctivae normal.  Neck:     Thyroid: No thyromegaly.     Vascular: No carotid bruit.     Comments: No thyromegaly Cardiovascular:     Rate and Rhythm: Normal rate and regular rhythm.     Pulses: Normal pulses.     Heart sounds: Normal heart sounds.  Pulmonary:     Breath sounds: Normal breath sounds.  Abdominal:     General: Abdomen is flat. Bowel sounds are normal.     Palpations: Abdomen is soft.  Lymphadenopathy:     Cervical: No cervical adenopathy.  Neurological:     Mental Status: She is alert and oriented to person, place, and time.  Psychiatric:        Mood and Affect: Mood normal.         Behavior: Behavior normal.     Lab Results  Component Value Date   WBC 5.3 10/06/2022   HGB 14.2 10/06/2022   HCT 41.7 10/06/2022   PLT 287 10/06/2022   GLUCOSE 104 (H) 10/06/2022   CHOL 222 (H) 10/06/2022   TRIG 154 (H) 10/06/2022   HDL 51 10/06/2022   LDLCALC 143 (H) 10/06/2022   ALT 6 10/06/2022   AST 15 10/06/2022   NA 143 10/06/2022   K 4.4 10/06/2022   CL 104 10/06/2022   CREATININE 0.80 10/06/2022   BUN 10 10/06/2022   CO2 22 10/06/2022   TSH 1.020 01/12/2021   HGBA1C 6.0 (H) 10/06/2022      Assessment & Plan:  Encounter for general adult medical examination without abnormal findings Assessment & Plan: Things to do to keep yourself healthy  - Exercise at least 30-45 minutes a day, 3-4 days a week.  - Eat a low-fat diet with lots of fruits and vegetables, up to 7-9 servings per day.  - Seatbelts can save your life. Wear them always.  - Smoke detectors on every level of your home, check batteries every year.  - Eye Doctor - have an eye exam every 1-2 years  - Safe sex - if you may be exposed to STDs, use a condom.  - Alcohol -  If you drink, do it moderately, less than 2 drinks per day.  - [place]. Choose someone to speak for you if you are not able.  - Depression is common in our stressful world.If you're feeling down or losing interest in things you normally enjoy, please come in for  a visit.  - Violence - If anyone is threatening or hurting you, please call immediately.   Orders: -     CBC With Diff/Platelet -     Comprehensive metabolic panel -     Lipid panel  Prediabetes Assessment & Plan: Hemoglobin A1c 5.8%, 3 month avg of blood sugars, is in prediabetic range.  In order to prevent progression to diabetes, recommend low carb diet and regular exercise   Orders: -     Hemoglobin A1c  Encounter for screening mammogram for malignant neoplasm of breast -     Digital Screening Mammogram, Left and Right; Future  Urinary  urgency Assessment & Plan: Check UA  Orders: -     POCT urinalysis dipstick  Class 1 obesity due to excess calories with serious comorbidity and body mass index (BMI) of 34.0 to 34.9 in adult Assessment & Plan: Start on zepbound Recommend continue to work on eating healthy diet and exercise. Comorbidities: prediabetes, hyperlipidemia.   Orders: -     Zepbound; Inject 2.5 mg into the skin once a week.  Dispense: 2 mL; Refill: 0  Mixed hyperlipidemia Assessment & Plan: Intolerant to numerous cholesterol medications.  Recommend continue to work on eating healthy diet and exercise. Strongly recommend weight loss.   Orders: -     Cardiovascular Risk Assessment  Mild recurrent major depression ([place]) Assessment & Plan: The current medical regimen is effective;  continue present plan and medications. Continue management by psychiatry.  Continue wellbutrin 75 mg once in am, ambien, xanax, and hydroxyzine.      Myalgia due to statin Assessment & Plan: Intolerant to statins.         Body mass index is 34.72 kg/m.   This is a list of the screening recommended for you and due dates:  Health Maintenance  Topic Date Due   DTaP/Tdap/Td vaccine (1 - Tdap) Never done   Flu Shot  Never done   Pap Smear  05/29/2023   Mammogram  09/22/2023   Colon Cancer Screening  09/16/2031   Hepatitis C Screening: USPSTF Recommendation to screen - Ages 18-79 yo.  Completed   HIV Screening  Completed   Zoster (Shingles) Vaccine  Completed   HPV Vaccine  Aged Out   COVID-19 Vaccine  Discontinued     Meds ordered this encounter  Medications   tirzepatide (ZEPBOUND) 2.5 MG/0.5ML Pen    Sig: Inject 2.5 mg into the skin once a week.    Dispense:  2 mL    Refill:  0    Follow-up: Return in about 3 months (around 01/04/2023) for chronic fasting.  An After Visit Summary was printed and given to the patient.   I,Lauren M Auman,acting as a scribe for Rochel Brome, MD.,have documented all  relevant documentation on the behalf of Rochel Brome, MD,as directed by  Rochel Brome, MD while in the presence of Rochel Brome, MD.    Geralynn Ochs I Leal-Borjas,acting as a scribe for Rochel Brome, MD.,have documented all relevant documentation on the behalf of Rochel Brome, MD,as directed by  Rochel Brome, MD while in the presence of Rochel Brome, MD.   I attest that I have reviewed this visit and agree with the plan scribed by my staff.   Rochel Brome, MD [facility] [phone]

## 2022-10-06 NOTE — Patient Instructions (Signed)

## 2022-10-07 DIAGNOSIS — Z6834 Body mass index (BMI) 34.0-34.9, adult: Secondary | ICD-10-CM | POA: Insufficient documentation

## 2022-10-07 DIAGNOSIS — R3915 Urgency of urination: Secondary | ICD-10-CM

## 2022-10-07 DIAGNOSIS — Z0000 Encounter for general adult medical examination without abnormal findings: Secondary | ICD-10-CM

## 2022-10-07 HISTORY — DX: Encounter for general adult medical examination without abnormal findings: Z00.00

## 2022-10-07 HISTORY — DX: Urgency of urination: R39.15

## 2022-10-07 LAB — CBC WITH DIFF/PLATELET
Basophils Absolute: 0.1 10*3/uL (ref 0.0–0.2)
Basos: 2 %
EOS (ABSOLUTE): 0.1 10*3/uL (ref 0.0–0.4)
Eos: 2 %
Hematocrit: 41.7 % (ref 34.0–46.6)
Hemoglobin: 14.2 g/dL (ref 11.1–15.9)
Immature Grans (Abs): 0 10*3/uL (ref 0.0–0.1)
Immature Granulocytes: 1 %
Lymphocytes Absolute: 1.7 10*3/uL (ref 0.7–3.1)
Lymphs: 32 %
MCH: 32.5 pg (ref 26.6–33.0)
MCHC: 34.1 g/dL (ref 31.5–35.7)
MCV: 95 fL (ref 79–97)
Monocytes Absolute: 0.4 10*3/uL (ref 0.1–0.9)
Monocytes: 7 %
Neutrophils Absolute: 3 10*3/uL (ref 1.4–7.0)
Neutrophils: 56 %
Platelets: 287 10*3/uL (ref 150–450)
RBC: 4.37 x10E6/uL (ref 3.77–5.28)
RDW: 13 % (ref 11.7–15.4)
WBC: 5.3 10*3/uL (ref 3.4–10.8)

## 2022-10-07 LAB — CARDIOVASCULAR RISK ASSESSMENT

## 2022-10-07 LAB — LIPID PANEL
Chol/HDL Ratio: 4.4 ratio (ref 0.0–4.4)
Cholesterol, Total: 222 mg/dL — ABNORMAL HIGH (ref 100–199)
HDL: 51 mg/dL (ref >39)
LDL Chol Calc (NIH): 143 mg/dL — ABNORMAL HIGH (ref 0–99)
Triglycerides: 154 mg/dL — ABNORMAL HIGH (ref 0–149)
VLDL Cholesterol Cal: 28 mg/dL (ref 5–40)

## 2022-10-07 LAB — COMPREHENSIVE METABOLIC PANEL
ALT: 6 IU/L (ref 0–32)
AST: 15 IU/L (ref 0–40)
Albumin/Globulin Ratio: 1.8 (ref 1.2–2.2)
Albumin: 4.3 g/dL (ref 3.9–4.9)
Alkaline Phosphatase: 91 IU/L (ref 44–121)
BUN/Creatinine Ratio: 13 (ref 12–28)
BUN: 10 mg/dL (ref 8–27)
Bilirubin Total: 0.3 mg/dL (ref 0.0–1.2)
CO2: 22 mmol/L (ref 20–29)
Calcium: 9.5 mg/dL (ref 8.7–10.3)
Chloride: 104 mmol/L (ref 96–106)
Creatinine, Ser: 0.8 mg/dL (ref 0.57–1.00)
Globulin, Total: 2.4 g/dL (ref 1.5–4.5)
Glucose: 104 mg/dL — ABNORMAL HIGH (ref 70–99)
Potassium: 4.4 mmol/L (ref 3.5–5.2)
Sodium: 143 mmol/L (ref 134–144)
Total Protein: 6.7 g/dL (ref 6.0–8.5)
eGFR: 83 mL/min/{1.73_m2} (ref >59)

## 2022-10-07 LAB — HEMOGLOBIN A1C
Est. average glucose Bld gHb Est-mCnc: 126 mg/dL
Hgb A1c MFr Bld: 6 % — ABNORMAL HIGH (ref 4.8–5.6)

## 2022-10-07 NOTE — Assessment & Plan Note (Signed)

## 2022-10-07 NOTE — Assessment & Plan Note (Signed)
Hemoglobin A1c 5.8%, 3 month avg of blood sugars, is in prediabetic range.  In order to prevent progression to diabetes, recommend low carb diet and regular exercise

## 2022-10-07 NOTE — Assessment & Plan Note (Signed)
Check UA

## 2022-10-07 NOTE — Assessment & Plan Note (Signed)
Recommend continue to work on eating healthy diet and exercise.  

## 2022-10-08 DIAGNOSIS — E66811 Obesity, class 1: Secondary | ICD-10-CM

## 2022-10-08 DIAGNOSIS — Z1231 Encounter for screening mammogram for malignant neoplasm of breast: Secondary | ICD-10-CM

## 2022-10-08 DIAGNOSIS — E6609 Other obesity due to excess calories: Secondary | ICD-10-CM | POA: Insufficient documentation

## 2022-10-08 HISTORY — DX: Other obesity due to excess calories: E66.09

## 2022-10-08 HISTORY — DX: Encounter for screening mammogram for malignant neoplasm of breast: Z12.31

## 2022-10-08 HISTORY — DX: Obesity, class 1: E66.811

## 2022-10-08 NOTE — Assessment & Plan Note (Signed)
Intolerant to statins. 

## 2022-10-08 NOTE — Assessment & Plan Note (Signed)
Start on zepbound Recommend continue to work on eating healthy diet and exercise. Comorbidities: prediabetes, hyperlipidemia.

## 2022-10-08 NOTE — Assessment & Plan Note (Signed)
Intolerant to numerous cholesterol medications.  Recommend continue to work on eating healthy diet and exercise. Strongly recommend weight loss.

## 2022-10-08 NOTE — Assessment & Plan Note (Signed)
The current medical regimen is effective;  continue present plan and medications. Continue management by psychiatry.  Continue wellbutrin 75 mg once in am, ambien, xanax, and hydroxyzine.

## 2022-10-09 NOTE — Progress Notes (Signed)
Blood count normal.  Liver function normal.  Kidney function normal.  Cholesterol: LDL very high at 143.  Triglycerides slightly elevated at 154.  Intolerant to numerous cholesterol medicines.  Ask patient if she wishes to try Praluent.  This is similar to [place], which she tried previously and did not tolerate.  Continue to work on a [place] and exercising. HBA1C: 6.  Consistent with prediabetes.

## 2022-11-10 DIAGNOSIS — F411 Generalized anxiety disorder: Secondary | ICD-10-CM | POA: Diagnosis not present

## 2022-11-10 DIAGNOSIS — F339 Major depressive disorder, recurrent, unspecified: Secondary | ICD-10-CM | POA: Diagnosis not present

## 2022-11-10 DIAGNOSIS — F5101 Primary insomnia: Secondary | ICD-10-CM | POA: Diagnosis not present

## 2022-11-27 ENCOUNTER — Ambulatory Visit
Admission: RE | Admit: 2022-11-27 | Discharge: 2022-11-27 | Disposition: A | Discharge status: Home / Self Care | Payer: BC Managed Care – PPO | Source: Ambulatory Visit | Attending: Family Medicine | Admitting: Family Medicine

## 2022-11-27 DIAGNOSIS — Z1231 Encounter for screening mammogram for malignant neoplasm of breast: Secondary | ICD-10-CM

## 2022-11-30 ENCOUNTER — Other Ambulatory Visit: Payer: Self-pay

## 2022-11-30 DIAGNOSIS — F32A Depression, unspecified: Secondary | ICD-10-CM | POA: Insufficient documentation

## 2022-12-08 ENCOUNTER — Encounter: Payer: Self-pay | Admitting: Cardiology

## 2022-12-08 ENCOUNTER — Ambulatory Visit: Payer: BC Managed Care – PPO | Attending: Cardiology | Admitting: Cardiology

## 2022-12-08 VITALS — BP 120/76 | HR 80 | Ht 64.0 in | Wt 192.2 lb

## 2022-12-08 DIAGNOSIS — Z6833 Body mass index (BMI) 33.0-33.9, adult: Secondary | ICD-10-CM

## 2022-12-08 DIAGNOSIS — E782 Mixed hyperlipidemia: Secondary | ICD-10-CM | POA: Diagnosis not present

## 2022-12-08 DIAGNOSIS — E6609 Other obesity due to excess calories: Secondary | ICD-10-CM

## 2022-12-08 DIAGNOSIS — R931 Abnormal findings on diagnostic imaging of heart and coronary circulation: Secondary | ICD-10-CM

## 2022-12-08 NOTE — Progress Notes (Signed)
Cardiology Office Note:    Date:  12/08/2022   ID:  Shelby Barnes, DOB 10-26-1960, MRN [id number]  PCP:  Blane Ohara, MD  Cardiologist:  Garwin Brothers, MD   Referring MD: Blane Ohara, MD    ASSESSMENT:    1. Elevated coronary artery calcium score   2. Class 1 obesity due to excess calories with serious comorbidity and body mass index (BMI) of 33.0 to 33.9 in adult   3. Mixed hyperlipidemia    PLAN:    In order of problems listed above:  Elevated calcium score: Secondary prevention stressed with the patient.  Importance of compliance with diet medication stressed and she vocalized understanding.  She was advised to walk at least abnormality 5 days a week and she promises to do so. Mixed dyslipidemia: On lipid-lowering medications followed by primary care.  Her lipids are markedly elevated.  I discussed statin therapy at length.  She is willing to try pitavastatin.  Will do blood work as a baseline today and get her started on 1 mg of pitavastatin.  Co-Q10 supplementation was encouraged and she vocalized understanding and questions were answered to her satisfaction. Obesity: Weight reduction stressed and diet was emphasized.  Risks of obesity explained. Patient will be seen in follow-up appointment in 6 months or earlier if the patient has any concerns.    Medication Adjustments/Labs and Tests Ordered: Current medicines are reviewed at length with the patient today.  Concerns regarding medicines are outlined above.  No orders of the defined types were placed in this encounter.  No orders of the defined types were placed in this encounter.    No chief complaint on file.    History of Present Illness:    Shelby Barnes is a 62 y.o. female.  Patient has past medical history of elevated calcium score, mixed dyslipidemia and obesity.  She tells me that she is an active lady.  She denies any chest pain orthopnea or PND.  She has not taken statins because she feels that the  side effects are worse than the benefits.  I do long discussion with this topic.  At the time of my evaluation, the patient is alert awake oriented and in no distress.  Past Medical History:  Diagnosis Date   Allergy 09/01/2021   fall- seasonal   Class 1 obesity due to excess calories with serious comorbidity and body mass index (BMI) of 33.0 to 33.9 in adult 05/31/2021   Class 1 obesity due to excess calories with serious comorbidity and body mass index (BMI) of 34.0 to 34.9 in adult 10/08/2022   Depression    Dysmenorrhea    Elevated coronary artery calcium score 03/17/2021   Encounter for general adult medical examination without abnormal findings 10/07/2022   Encounter for screening mammogram for malignant neoplasm of breast 10/08/2022   Family history of heart disease 01/09/2022   Hyperlipidemia 03/17/2021   Mild recurrent major depression 03/01/2022   Myalgia due to statin 01/09/2022   Prediabetes 05/31/2021   Urinary urgency 10/07/2022    Past Surgical History:  Procedure Laterality Date   BREAST EXCISIONAL BIOPSY Left 1998   Fibroadenoma   BREAST SURGERY Left 09/1996   fibroadenema   COLONOSCOPY  05/2011    Current Medications: Current Meds  Medication Sig   acyclovir (ZOVIRAX) 400 MG tablet Two tablet daily for HSV suppression   ALPRAZolam (XANAX) 1 MG tablet Take 1 mg by mouth at bedtime as needed for anxiety.   AMBIEN 10 MG  tablet Take 5-10 mg by mouth at bedtime as needed for sleep.   B Complex Vitamins (VITAMIN-B COMPLEX PO) Take 1 capsule by mouth daily.   buPROPion (WELLBUTRIN) 75 MG tablet Take 75 mg by mouth every morning.   Cholecalciferol (D3 PO) Take 1 tablet by mouth daily.   co-enzyme Q-10 30 MG capsule Take 30 mg by mouth 3 (three) times daily.   Multiple Vitamin (MULTIVITAMIN) capsule Take 1 capsule by mouth daily.   prazosin (MINIPRESS) 1 MG capsule Take 1 mg by mouth at bedtime.   Zinc Acetate, Oral, (ZINC ACETATE PO) Take 1 tablet by mouth daily.      Allergies:   Lipitor [atorvastatin], Amoxicillin-pot clavulanate, Codeine, Crestor [rosuvastatin], Hydrocodone, Nexletol [bempedoic acid], Repatha [evolocumab], and Zetia [ezetimibe]   Social History   Socioeconomic History   Marital status: Divorced    Spouse name: Not on file   Number of children: 0   Years of education: Not on file   Highest education level: Not on file  Occupational History   Occupation: Barista  Tobacco Use   Smoking status: Never   Smokeless tobacco: Never  Vaping Use   Vaping Use: Never used  Substance and Sexual Activity   Alcohol use: Yes    Alcohol/week: 1.0 standard drink of alcohol    Types: 1 Standard drinks or equivalent per week    Comment: 2 drinks per month   Drug use: Never   Sexual activity: Not Currently    Partners: Male    Birth control/protection: Post-menopausal  Other Topics Concern   Not on file  Social History Narrative   Not on file   Social Determinants of Health   Financial Resource Strain: Low Risk  (10/06/2022)   Overall Financial Resource Strain (CARDIA)    Difficulty of Paying Living Expenses: Not hard at all  Food Insecurity: No Food Insecurity (10/06/2022)   Hunger Vital Sign    Worried About Running Out of Food in the Last Year: Never true    Ran Out of Food in the Last Year: Never true  Transportation Needs: No Transportation Needs (10/06/2022)   PRAPARE - Administrator, Civil Service (Medical): No    Lack of Transportation (Non-Medical): No  Physical Activity: Insufficiently Active (10/06/2022)   Exercise Vital Sign    Days of Exercise per Week: 3 days    Minutes of Exercise per Session: 20 min  Stress: No Stress Concern Present (10/06/2022)   [employer] of Occupational Health - Occupational Stress Questionnaire    Feeling of Stress : Not at all  Social Connections: Socially Isolated (10/06/2022)   Social Connection and Isolation Panel [NHANES]    Frequency of Communication  with Friends and Family: Three times a week    Frequency of Social Gatherings with Friends and Family: Never    Attends Religious Services: Never    Database administrator or Organizations: No    Attends Engineer, structural: Never    Marital Status: Never married     Family History: The patient's family history includes Breast cancer (age of onset: 65) in her maternal aunt; Diabetes in her maternal grandmother; Diabetes (age of onset: 32) in her mother; Heart failure in her father and paternal grandfather; Stroke in her paternal grandmother. There is no history of Colon cancer, Colon polyps, Esophageal cancer, Rectal cancer, or Stomach cancer.  ROS:   Please see the history of present illness.    All other systems reviewed and are  negative.  EKGs/Labs/Other Studies Reviewed:    The following studies were reviewed today: I discussed my findings with the patient at length   Recent Labs: 10/06/2022: ALT 6; BUN 10; Creatinine, Ser 0.80; Hemoglobin 14.2; Platelets 287; Potassium 4.4; Sodium 143  Recent Lipid Panel    Component Value Date/Time   CHOL 222 (H) 10/06/2022 1054   TRIG 154 (H) 10/06/2022 1054   HDL 51 10/06/2022 1054   CHOLHDL 4.4 10/06/2022 1054   LDLCALC 143 (H) 10/06/2022 1054    Physical Exam:    VS:  BP 120/76   Pulse 80   Ht 5\' 4"  (1.626 m)   Wt 192 lb 3.2 oz (87.2 kg)   LMP 07/21/2014   SpO2 99%   BMI 32.99 kg/m     Wt Readings from Last 3 Encounters:  12/08/22 192 lb 3.2 oz (87.2 kg)  10/06/22 196 lb (88.9 kg)  07/19/22 193 lb (87.5 kg)     GEN: Patient is in no acute distress HEENT: Normal NECK: No JVD; No carotid bruits LYMPHATICS: No lymphadenopathy CARDIAC: Hear sounds regular, 2/6 systolic murmur at the apex. RESPIRATORY:  Clear to auscultation without rales, wheezing or rhonchi  ABDOMEN: Soft, non-tender, non-distended MUSCULOSKELETAL:  No edema; No deformity  SKIN: Warm and dry NEUROLOGIC:  Alert and oriented x 3 PSYCHIATRIC:   Normal affect   Signed, Garwin Brothers, MD  12/08/2022 8:34 AM    [facility]

## 2022-12-08 NOTE — Patient Instructions (Signed)
Medication Instructions:  Your physician recommends that you continue on your current medications as directed. Please refer to the Current Medication list given to you today.  *If you need a refill on your cardiac medications before your next appointment, please call your pharmacy*   Lab Work: Your physician recommends that you return for lab work in:   Labs today: BMP, TSH, LFT  If you have labs (blood work) drawn today and your tests are completely normal, you will receive your results only by: MyChart Message (if you have MyChart) OR A paper copy in the mail If you have any lab test that is abnormal or we need to change your treatment, we will call you to review the results.   Testing/Procedures: None   Follow-Up: At [facility], you and your health needs are our priority.  As part of our continuing mission to provide you with exceptional heart care, we have created designated Provider Care Teams.  These Care Teams include your primary Cardiologist (physician) and Advanced Practice Providers (APPs -  Physician Assistants and Nurse Practitioners) who all work together to provide you with the care you need, when you need it.  We recommend signing up for the patient portal called "MyChart".  Sign up information is provided on this After Visit Summary.  MyChart is used to connect with patients for Virtual Visits (Telemedicine).  Patients are able to view lab/test results, encounter notes, upcoming appointments, etc.  Non-urgent messages can be sent to your provider as well.   To learn more about what you can do with MyChart, go to [email or web address].    Your next appointment:   1 year(s)  Provider:   Belva Crome, MD    Other Instructions None

## 2022-12-08 NOTE — Addendum Note (Signed)
Addended by: Roxanne Mins I on: 12/08/2022 08:47 AM   Modules accepted: Orders

## 2022-12-09 LAB — BASIC METABOLIC PANEL
BUN/Creatinine Ratio: 21 (ref 12–28)
BUN: 16 mg/dL (ref 8–27)
CO2: 21 mmol/L (ref 20–29)
Calcium: 9.3 mg/dL (ref 8.7–10.3)
Chloride: 105 mmol/L (ref 96–106)
Creatinine, Ser: 0.77 mg/dL (ref 0.57–1.00)
Glucose: 88 mg/dL (ref 70–99)
Potassium: 4.3 mmol/L (ref 3.5–5.2)
Sodium: 141 mmol/L (ref 134–144)
eGFR: 87 mL/min/{1.73_m2} (ref >59)

## 2022-12-09 LAB — HEPATIC FUNCTION PANEL
ALT: 8 IU/L (ref 0–32)
AST: 14 IU/L (ref 0–40)
Albumin: 4.3 g/dL (ref 3.9–4.9)
Alkaline Phosphatase: 95 IU/L (ref 44–121)
Bilirubin Total: 0.3 mg/dL (ref 0.0–1.2)
Bilirubin, Direct: <0.1 mg/dL (ref 0.00–0.40)
Total Protein: 6.2 g/dL (ref 6.0–8.5)

## 2022-12-09 LAB — TSH: TSH: 0.738 u[IU]/mL (ref 0.450–4.500)

## 2022-12-13 ENCOUNTER — Telehealth: Payer: Self-pay | Admitting: Cardiology

## 2022-12-13 DIAGNOSIS — R931 Abnormal findings on diagnostic imaging of heart and coronary circulation: Secondary | ICD-10-CM

## 2022-12-13 DIAGNOSIS — E782 Mixed hyperlipidemia: Secondary | ICD-10-CM

## 2022-12-13 MED ORDER — PITAVASTATIN CALCIUM 1 MG PO TABS: 1.0000 mg | ORAL_TABLET | Freq: Every day | ORAL | 12 refills | Status: DC

## 2022-12-13 MED ORDER — PITAVASTATIN CALCIUM 1 MG PO TABS: 1.0000 mg | ORAL_TABLET | Freq: Every day | ORAL | 3 refills | Status: DC

## 2022-12-13 NOTE — Telephone Encounter (Signed)
Left VM to callback and MyChart message sent.

## 2022-12-13 NOTE — Telephone Encounter (Signed)
-----   Message from Garwin Brothers, MD sent at 12/11/2022 10:30 AM EDT ----- Livalo 1 mg daily.  She needs to take co-Q10 also over-the-counter.  Diet and exercise and liver lipid check in 6 weeks.  Copy primary Garwin Brothers, MD 12/11/2022 10:30 AM

## 2022-12-13 NOTE — Telephone Encounter (Signed)
Pt calling back about medication from lab results viewed in mychart. She would the medications sent over to the medication  [id number] [place], [place] - [address] AT [facility] [address] Phone: [phone]  Fax: [phone]

## 2022-12-13 NOTE — Telephone Encounter (Signed)
Patient called stating that she had some blood work completed, but did not see anything about checking her Lipids. Patient would like to know if she was supposed to have her Lipids checked. Patient stated that Dr. Tomie China was going to start her on a new Cholesterol medication, but was unsure the name of the medication. Patient stated that she hasn't seen the medication get called in at her pharmacy. Please advise.

## 2023-01-05 DIAGNOSIS — F411 Generalized anxiety disorder: Secondary | ICD-10-CM | POA: Diagnosis not present

## 2023-01-05 DIAGNOSIS — F339 Major depressive disorder, recurrent, unspecified: Secondary | ICD-10-CM | POA: Diagnosis not present

## 2023-01-05 DIAGNOSIS — F5101 Primary insomnia: Secondary | ICD-10-CM | POA: Diagnosis not present

## 2023-01-11 NOTE — Progress Notes (Unsigned)
Subjective:  Patient ID: Shelby Barnes, female    DOB: [birth year]  Age: 62 y.o. MRN: [id number]  Chief Complaint  Patient presents with   Hyperlipidemia    HPI Hyperlipidemia: Patient was started on livalo recently per cardiology and she is having leg pain  Tried coenzyme a10. Did not help. Trying flax oil 1 tbsp daily and citrus bergomot. She has been taking these otc since February. Intolerant to numerous cholesterol medicines.  Weight loss: Patient was not approved for zepbound.  Weight is down 8 lbs since Feb. 2024. Patient is eating healthy and exercising.      01/12/2023   10:29 AM 10/06/2022   10:12 AM 05/19/2021    8:15 AM 12/13/2020    9:00 AM  Depression screen PHQ 2/9  Decreased Interest 1 1 1  0  Down, Depressed, Hopeless 2 1 1  0  PHQ - 2 Score 3 2 2  0  Altered sleeping 3 3 3    Tired, decreased energy 0 1 1   Change in appetite 0 0 0   Feeling bad or failure about yourself  0  1   Trouble concentrating 0 0 1   Moving slowly or fidgety/restless 0 0 0   Suicidal thoughts 0 0 0   PHQ-9 Score 6 6 8    Difficult doing work/chores Not difficult at all Somewhat difficult Not difficult at all         01/12/2023   10:29 AM  Fall Risk   Falls in the past year? 0  Number falls in past yr: 0  Injury with Fall? 0  Risk for fall due to : No Fall Risks  Follow up Falls evaluation completed;Falls prevention discussed    Patient Care Team: Xiomara Sevillano, Fritzi Mandes, MD as PCP - General (Family Medicine)   Review of Systems  Constitutional:  Negative for chills, fatigue and fever.  HENT:  Negative for congestion, rhinorrhea and sore throat.   Respiratory:  Negative for cough and shortness of breath.   Cardiovascular:  Negative for chest pain.  Gastrointestinal:  Positive for abdominal distention and nausea. Negative for abdominal pain, constipation, diarrhea and vomiting.  Genitourinary:  Negative for dysuria and urgency.  Musculoskeletal:  Negative for back pain and myalgias.   Neurological:  Positive for light-headedness and headaches. Negative for dizziness and weakness.  Psychiatric/Behavioral:  Negative for dysphoric mood. The patient is not nervous/anxious.     Current Outpatient Medications on File Prior to Visit  Medication Sig Dispense Refill   acyclovir (ZOVIRAX) 400 MG tablet Two tablet daily for HSV suppression 180 tablet 4   ALPRAZolam (XANAX) 1 MG tablet Take 1 mg by mouth at bedtime as needed for anxiety.     AMBIEN 10 MG tablet Take 5-10 mg by mouth at bedtime as needed for sleep.     B Complex Vitamins (VITAMIN-B COMPLEX PO) Take 1 capsule by mouth daily.     buPROPion (WELLBUTRIN) 75 MG tablet Take 75 mg by mouth every morning.     Cholecalciferol (D3 PO) Take 1 tablet by mouth daily.     co-enzyme Q-10 30 MG capsule Take 30 mg by mouth 3 (three) times daily.     Multiple Vitamin (MULTIVITAMIN) capsule Take 1 capsule by mouth daily.     Pitavastatin Calcium (LIVALO) 1 MG TABS Take 1 tablet (1 mg total) by mouth daily. 90 tablet 3   prazosin (MINIPRESS) 1 MG capsule Take 1 mg by mouth at bedtime.     Zinc Acetate, Oral, (ZINC  ACETATE PO) Take 1 tablet by mouth daily.     No current facility-administered medications on file prior to visit.   Past Medical History:  Diagnosis Date   Allergy 09/01/2021   fall- seasonal   Class 1 obesity due to excess calories with serious comorbidity and body mass index (BMI) of 33.0 to 33.9 in adult 05/31/2021   Class 1 obesity due to excess calories with serious comorbidity and body mass index (BMI) of 34.0 to 34.9 in adult 10/08/2022   Depression    Dysmenorrhea    Elevated coronary artery calcium score 03/17/2021   Encounter for general adult medical examination without abnormal findings 10/07/2022   Encounter for screening mammogram for malignant neoplasm of breast 10/08/2022   Family history of heart disease 01/09/2022   Hyperlipidemia 03/17/2021   Mild recurrent major depression (HCC) 03/01/2022    Myalgia due to statin 01/09/2022   Prediabetes 05/31/2021   Urinary urgency 10/07/2022   Past Surgical History:  Procedure Laterality Date   BREAST EXCISIONAL BIOPSY Left 1998   Fibroadenoma   BREAST SURGERY Left 09/1996   fibroadenema   COLONOSCOPY  05/2011    Family History  Problem Relation Age of Onset   Diabetes Mother 5       Whipple Procedure from cyst on pancrease   Heart failure Father    Breast cancer Maternal Aunt 19       with metasis   Diabetes Maternal Grandmother    Stroke Paternal Grandmother    Heart failure Paternal Grandfather    Colon cancer Neg Hx    Colon polyps Neg Hx    Esophageal cancer Neg Hx    Rectal cancer Neg Hx    Stomach cancer Neg Hx    Social History   Socioeconomic History   Marital status: Divorced    Spouse name: Not on file   Number of children: 0   Years of education: Not on file   Highest education level: Not on file  Occupational History   Occupation: Barista  Tobacco Use   Smoking status: Never   Smokeless tobacco: Never  Vaping Use   Vaping Use: Never used  Substance and Sexual Activity   Alcohol use: Yes    Alcohol/week: 1.0 standard drink of alcohol    Types: 1 Standard drinks or equivalent per week    Comment: 2 drinks per month   Drug use: Never   Sexual activity: Not Currently    Partners: Male    Birth control/protection: Post-menopausal  Other Topics Concern   Not on file  Social History Narrative   Not on file   Social Determinants of Health   Financial Resource Strain: Low Risk  (10/06/2022)   Overall Financial Resource Strain (CARDIA)    Difficulty of Paying Living Expenses: Not hard at all  Food Insecurity: No Food Insecurity (10/06/2022)   Hunger Vital Sign    Worried About Running Out of Food in the Last Year: Never true    Ran Out of Food in the Last Year: Never true  Transportation Needs: No Transportation Needs (10/06/2022)   PRAPARE - Administrator, Civil Service  (Medical): No    Lack of Transportation (Non-Medical): No  Physical Activity: Insufficiently Active (10/06/2022)   Exercise Vital Sign    Days of Exercise per Week: 3 days    Minutes of Exercise per Session: 20 min  Stress: No Stress Concern Present (10/06/2022)   [employer] of Occupational Health - Occupational Stress  Questionnaire    Feeling of Stress : Not at all  Social Connections: Socially Isolated (10/06/2022)   Social Connection and Isolation Panel [NHANES]    Frequency of Communication with Friends and Family: Three times a week    Frequency of Social Gatherings with Friends and Family: Never    Attends Religious Services: Never    Diplomatic Services operational officer: No    Attends Engineer, structural: Never    Marital Status: Never married    Objective:  BP 102/60   Pulse 72   Temp (!) 96.4 F (35.8 C)   Resp 14   Ht 5\' 4"  (1.626 m)   Wt 188 lb (85.3 kg)   LMP 07/21/2014   BMI 32.27 kg/m      01/12/2023   10:18 AM 12/08/2022    7:53 AM 10/06/2022   10:27 AM  BP/Weight  Systolic BP 102 120 134  Diastolic BP 60 76 84  Wt. (Lbs) 188 192.2 196  BMI 32.27 kg/m2 32.99 kg/m2 34.72 kg/m2    Physical Exam Vitals reviewed.  Constitutional:      Appearance: Normal appearance.  Neck:     Vascular: No carotid bruit.  Cardiovascular:     Rate and Rhythm: Normal rate and regular rhythm.     Heart sounds: Normal heart sounds.  Pulmonary:     Effort: Pulmonary effort is normal. No respiratory distress.     Breath sounds: Normal breath sounds.  Abdominal:     General: Abdomen is flat. Bowel sounds are normal.     Palpations: Abdomen is soft.     Tenderness: There is no abdominal tenderness.  Neurological:     Mental Status: She is alert and oriented to person, place, and time.  Psychiatric:        Mood and Affect: Mood normal.        Behavior: Behavior normal.     Diabetic Foot Exam - Simple   No data filed      Lab Results  Component  Value Date   WBC 5.3 01/12/2023   HGB 14.9 01/12/2023   HCT 43.8 01/12/2023   PLT 301 01/12/2023   GLUCOSE 102 (H) 01/12/2023   CHOL 225 (H) 01/12/2023   TRIG 82 01/12/2023   HDL 68 01/12/2023   LDLCALC 143 (H) 01/12/2023   ALT 8 01/12/2023   AST 17 01/12/2023   NA 139 01/12/2023   K 4.3 01/12/2023   CL 103 01/12/2023   CREATININE 0.91 01/12/2023   BUN 11 01/12/2023   CO2 23 01/12/2023   TSH 0.738 12/08/2022   HGBA1C 6.0 (H) 01/12/2023      Assessment & Plan:    Prediabetes Assessment & Plan: Recommend continue to work on eating healthy diet and exercise.   Orders: -     CBC with Differential/Platelet -     Comprehensive metabolic panel -     Hemoglobin A1c  Mixed hyperlipidemia Assessment & Plan: Intolerant to numerous cholesterol medications.  Recommend continue to work on eating healthy diet and exercise. Strongly encouraged to continue working on weight loss.  Consider addition of red yeast rice and Niaspan extended release.  Orders: -     Lipid panel  Elevated coronary artery calcium score Assessment & Plan: High risk for heart disease.    Myalgia due to statin Assessment & Plan: Intolerant to statins.    Mild recurrent major depression (HCC) Assessment & Plan: The current medical regimen is effective;  continue present plan  and medications. Continue management by psychiatry.  Continue wellbutrin 75 mg once in am, ambien, xanax.   Class 1 obesity due to excess calories with serious comorbidity and body mass index (BMI) of 32.0 to 32.9 in adult Assessment & Plan: Recommend continue to work on eating healthy diet and exercise. Consider contrave.   Other orders -     Cardiovascular Risk Assessment     No orders of the defined types were placed in this encounter.   Orders Placed This Encounter  Procedures   CBC with Differential/Platelet   Comprehensive metabolic panel   Hemoglobin A1c   Lipid panel   Cardiovascular Risk Assessment      Follow-up: Return in about 3 months (around 04/14/2023) for chronic fasting.   I,Marla I Leal-Borjas,acting as a scribe for Blane Ohara, MD.,have documented all relevant documentation on the behalf of Blane Ohara, MD,as directed by  Blane Ohara, MD while in the presence of Blane Ohara, MD.   An After Visit Summary was printed and given to the patient.  Blane Ohara, MD [facility] [phone]

## 2023-01-12 ENCOUNTER — Ambulatory Visit: Payer: BC Managed Care – PPO | Admitting: Family Medicine

## 2023-01-12 ENCOUNTER — Encounter: Payer: Self-pay | Admitting: Family Medicine

## 2023-01-12 VITALS — BP 102/60 | HR 72 | Temp 96.4°F | Resp 14 | Ht 64.0 in | Wt 188.0 lb

## 2023-01-12 DIAGNOSIS — R931 Abnormal findings on diagnostic imaging of heart and coronary circulation: Secondary | ICD-10-CM

## 2023-01-12 DIAGNOSIS — M791 Myalgia, unspecified site: Secondary | ICD-10-CM

## 2023-01-12 DIAGNOSIS — E782 Mixed hyperlipidemia: Secondary | ICD-10-CM

## 2023-01-12 DIAGNOSIS — R7303 Prediabetes: Secondary | ICD-10-CM

## 2023-01-12 DIAGNOSIS — E6609 Other obesity due to excess calories: Secondary | ICD-10-CM

## 2023-01-12 DIAGNOSIS — Z6832 Body mass index (BMI) 32.0-32.9, adult: Secondary | ICD-10-CM

## 2023-01-12 DIAGNOSIS — T466X5A Adverse effect of antihyperlipidemic and antiarteriosclerotic drugs, initial encounter: Secondary | ICD-10-CM

## 2023-01-12 DIAGNOSIS — F330 Major depressive disorder, recurrent, mild: Secondary | ICD-10-CM

## 2023-01-12 NOTE — Patient Instructions (Addendum)
Take a smoothy with fiber every day to help stomach.  Review contrave for possible weight loss.   Consider addition of red yeast rice and Niaspan extended release.  Niacin is discussed with her fully. The patient is informed that this drug often have significant side effects that require the dose to be slowly built up to effective levels; many persons cannot continue the drug due to side effects, but it is very effective if the side effects are tolerable. Flushing, itching and feeling hot will likely occur after dosing, especially with higher doses. This can be eliminated or reduced by taking an aspirin 30 minutes before the niacin.  Also avoiding coffee and alcohol when taking medicine. Dosing at night time will help. A high fiber snack 30 minutes prior to taking medicine can also help.  If these side effects occur and are not tolerable, the drug will be discontinued. She may use either the immediate release 50 mg tabs and up-titrate, or use the timed-release larger doses, both available OTC. The effective dose is between 1000 and 2000 mg per day. Hyperglycemia and abnormal LFT's may occur. Repeat lipids along with LFT's in 3 months.

## 2023-01-13 LAB — COMPREHENSIVE METABOLIC PANEL
ALT: 8 IU/L (ref 0–32)
AST: 17 IU/L (ref 0–40)
Albumin/Globulin Ratio: 2.1 (ref 1.2–2.2)
Albumin: 4.4 g/dL (ref 3.9–4.9)
Alkaline Phosphatase: 94 IU/L (ref 44–121)
BUN/Creatinine Ratio: 12 (ref 12–28)
BUN: 11 mg/dL (ref 8–27)
Bilirubin Total: 0.5 mg/dL (ref 0.0–1.2)
CO2: 23 mmol/L (ref 20–29)
Calcium: 9.3 mg/dL (ref 8.7–10.3)
Chloride: 103 mmol/L (ref 96–106)
Creatinine, Ser: 0.91 mg/dL (ref 0.57–1.00)
Globulin, Total: 2.1 g/dL (ref 1.5–4.5)
Glucose: 102 mg/dL — ABNORMAL HIGH (ref 70–99)
Potassium: 4.3 mmol/L (ref 3.5–5.2)
Sodium: 139 mmol/L (ref 134–144)
Total Protein: 6.5 g/dL (ref 6.0–8.5)
eGFR: 71 mL/min/{1.73_m2} (ref >59)

## 2023-01-13 LAB — CBC WITH DIFFERENTIAL/PLATELET
Basophils Absolute: 0.1 10*3/uL (ref 0.0–0.2)
Basos: 1 %
EOS (ABSOLUTE): 0.1 10*3/uL (ref 0.0–0.4)
Eos: 2 %
Hematocrit: 43.8 % (ref 34.0–46.6)
Hemoglobin: 14.9 g/dL (ref 11.1–15.9)
Immature Grans (Abs): 0 10*3/uL (ref 0.0–0.1)
Immature Granulocytes: 1 %
Lymphocytes Absolute: 1.9 10*3/uL (ref 0.7–3.1)
Lymphs: 36 %
MCH: 32.6 pg (ref 26.6–33.0)
MCHC: 34 g/dL (ref 31.5–35.7)
MCV: 96 fL (ref 79–97)
Monocytes Absolute: 0.4 10*3/uL (ref 0.1–0.9)
Monocytes: 8 %
Neutrophils Absolute: 2.8 10*3/uL (ref 1.4–7.0)
Neutrophils: 52 %
Platelets: 301 10*3/uL (ref 150–450)
RBC: 4.57 x10E6/uL (ref 3.77–5.28)
RDW: 12.4 % (ref 11.7–15.4)
WBC: 5.3 10*3/uL (ref 3.4–10.8)

## 2023-01-13 LAB — HEMOGLOBIN A1C
Est. average glucose Bld gHb Est-mCnc: 126 mg/dL
Hgb A1c MFr Bld: 6 % — ABNORMAL HIGH (ref 4.8–5.6)

## 2023-01-13 LAB — LIPID PANEL
Chol/HDL Ratio: 3.3 ratio (ref 0.0–4.4)
Cholesterol, Total: 225 mg/dL — ABNORMAL HIGH (ref 100–199)
HDL: 68 mg/dL (ref >39)
LDL Chol Calc (NIH): 143 mg/dL — ABNORMAL HIGH (ref 0–99)
Triglycerides: 82 mg/dL (ref 0–149)
VLDL Cholesterol Cal: 14 mg/dL (ref 5–40)

## 2023-01-13 LAB — CARDIOVASCULAR RISK ASSESSMENT

## 2023-01-15 NOTE — Assessment & Plan Note (Signed)
Recommend continue to work on eating healthy diet and exercise.  

## 2023-01-15 NOTE — Assessment & Plan Note (Signed)
Recommend continue to work on eating healthy diet and exercise. Consider contrave.

## 2023-01-15 NOTE — Assessment & Plan Note (Signed)
High risk for heart disease.

## 2023-01-15 NOTE — Assessment & Plan Note (Signed)
Intolerant to statins. 

## 2023-01-15 NOTE — Assessment & Plan Note (Signed)
Intolerant to numerous cholesterol medications.  Recommend continue to work on eating healthy diet and exercise. Strongly encouraged to continue working on weight loss.  Consider addition of red yeast rice and Niaspan extended release.

## 2023-01-15 NOTE — Assessment & Plan Note (Signed)
The current medical regimen is effective;  continue present plan and medications. Continue management by psychiatry.  Continue wellbutrin 75 mg once in am, ambien, xanax.

## 2023-02-12 ENCOUNTER — Telehealth: Payer: Self-pay | Admitting: Cardiology

## 2023-02-12 DIAGNOSIS — E782 Mixed hyperlipidemia: Secondary | ICD-10-CM

## 2023-02-12 DIAGNOSIS — Z789 Other specified health status: Secondary | ICD-10-CM

## 2023-02-12 NOTE — Telephone Encounter (Signed)
Pt c/o medication issue:  1. Name of Medication:  Pitavastatin Calcium (LIVALO) 1 MG TAB   2. How are you currently taking this medication (dosage and times per day)?  As prescribed  3. Are you having a reaction (difficulty breathing--STAT)?   4. What is your medication issue?   Patient states she took this medication for 3 weeks when it was prescribed then she stopped taking it shortly after because it caused back pain and sciatica all down her legs and hands. She has not been taking it but still experiences pain.

## 2023-02-12 NOTE — Telephone Encounter (Signed)
Left a VM for pt to call back.

## 2023-02-12 NOTE — Telephone Encounter (Signed)
FYI Spoke with pt who states that she cannot take the Livalo. Pt states that she had increased pain in her muscles and for 3 days couldn't get out of bed. Pt states that she has been off the medication for 2 weeks and has finally gotten some better but it has not completley resolved. Advised that we will refer her to the lipid clinic.

## 2023-02-12 NOTE — Addendum Note (Signed)
Addended by: Eleonore Chiquito on: 02/12/2023 02:45 PM   Modules accepted: Orders

## 2023-02-13 DIAGNOSIS — N951 Menopausal and female climacteric states: Secondary | ICD-10-CM | POA: Diagnosis not present

## 2023-02-13 DIAGNOSIS — E222 Syndrome of inappropriate secretion of antidiuretic hormone: Secondary | ICD-10-CM | POA: Diagnosis not present

## 2023-02-13 DIAGNOSIS — E079 Disorder of thyroid, unspecified: Secondary | ICD-10-CM | POA: Diagnosis not present

## 2023-02-13 DIAGNOSIS — M810 Age-related osteoporosis without current pathological fracture: Secondary | ICD-10-CM | POA: Diagnosis not present

## 2023-02-13 DIAGNOSIS — E039 Hypothyroidism, unspecified: Secondary | ICD-10-CM | POA: Diagnosis not present

## 2023-02-13 DIAGNOSIS — R5383 Other fatigue: Secondary | ICD-10-CM | POA: Diagnosis not present

## 2023-02-13 DIAGNOSIS — Z79890 Hormone replacement therapy (postmenopausal): Secondary | ICD-10-CM | POA: Diagnosis not present

## 2023-02-13 DIAGNOSIS — E289 Ovarian dysfunction, unspecified: Secondary | ICD-10-CM | POA: Diagnosis not present

## 2023-02-13 DIAGNOSIS — E063 Autoimmune thyroiditis: Secondary | ICD-10-CM | POA: Diagnosis not present

## 2023-02-13 DIAGNOSIS — E559 Vitamin D deficiency, unspecified: Secondary | ICD-10-CM | POA: Diagnosis not present

## 2023-02-19 DIAGNOSIS — E559 Vitamin D deficiency, unspecified: Secondary | ICD-10-CM | POA: Diagnosis not present

## 2023-02-19 DIAGNOSIS — N951 Menopausal and female climacteric states: Secondary | ICD-10-CM | POA: Diagnosis not present

## 2023-02-19 DIAGNOSIS — Z79890 Hormone replacement therapy (postmenopausal): Secondary | ICD-10-CM | POA: Diagnosis not present

## 2023-03-02 DIAGNOSIS — F339 Major depressive disorder, recurrent, unspecified: Secondary | ICD-10-CM | POA: Diagnosis not present

## 2023-03-02 DIAGNOSIS — F5101 Primary insomnia: Secondary | ICD-10-CM | POA: Diagnosis not present

## 2023-03-02 DIAGNOSIS — F411 Generalized anxiety disorder: Secondary | ICD-10-CM | POA: Diagnosis not present

## 2023-03-12 ENCOUNTER — Ambulatory Visit: Payer: BC Managed Care – PPO

## 2023-04-03 DIAGNOSIS — E039 Hypothyroidism, unspecified: Secondary | ICD-10-CM | POA: Diagnosis not present

## 2023-04-03 DIAGNOSIS — E7211 Homocystinuria: Secondary | ICD-10-CM | POA: Diagnosis not present

## 2023-04-03 DIAGNOSIS — R739 Hyperglycemia, unspecified: Secondary | ICD-10-CM | POA: Diagnosis not present

## 2023-04-03 DIAGNOSIS — M810 Age-related osteoporosis without current pathological fracture: Secondary | ICD-10-CM | POA: Diagnosis not present

## 2023-04-03 DIAGNOSIS — Z13220 Encounter for screening for lipoid disorders: Secondary | ICD-10-CM | POA: Diagnosis not present

## 2023-04-03 DIAGNOSIS — Z79890 Hormone replacement therapy (postmenopausal): Secondary | ICD-10-CM | POA: Diagnosis not present

## 2023-04-03 DIAGNOSIS — E559 Vitamin D deficiency, unspecified: Secondary | ICD-10-CM | POA: Diagnosis not present

## 2023-04-03 DIAGNOSIS — N951 Menopausal and female climacteric states: Secondary | ICD-10-CM | POA: Diagnosis not present

## 2023-04-06 DIAGNOSIS — H00022 Hordeolum internum right lower eyelid: Secondary | ICD-10-CM | POA: Diagnosis not present

## 2023-04-07 LAB — LAB REPORT - SCANNED
A1c: 5.9
EGFR: 78

## 2023-04-12 DIAGNOSIS — M7061 Trochanteric bursitis, right hip: Secondary | ICD-10-CM | POA: Diagnosis not present

## 2023-05-04 ENCOUNTER — Ambulatory Visit: Payer: BC Managed Care – PPO | Admitting: Family Medicine

## 2023-06-01 DIAGNOSIS — F339 Major depressive disorder, recurrent, unspecified: Secondary | ICD-10-CM | POA: Diagnosis not present

## 2023-06-01 DIAGNOSIS — F5101 Primary insomnia: Secondary | ICD-10-CM | POA: Diagnosis not present

## 2023-06-01 DIAGNOSIS — F411 Generalized anxiety disorder: Secondary | ICD-10-CM | POA: Diagnosis not present

## 2023-06-20 NOTE — Progress Notes (Signed)
62 y.o. G0P0000 Divorced Caucasian female here for annual exam.    Difficulty with losing weight.  Tried Ozempic and had nausea.  Seeing integrative, holistic doctor, since June.  Office is [facility], Dr. Marlowe Kays.  Did comprehensive labs. Now doing cleaning eating and walking more.   Using pellets with estrogen and testosterone and taking oral progesterone.  Went back on these after being off for several years.  She feels it helps her depression.   Takes Acyclovir daily.    PCP: Blane Ohara, MD  Searching for new cardiologist.  Patient's last menstrual period was 07/21/2014.           Sexually active: No.  The current method of family planning is post menopausal status.    Exercising: Yes.     Walking 3-4x a week Smoker:  no  OB History  Gravida Para Term Preterm AB Living  0 0 0 0 0 0  SAB IAB Ectopic Multiple Live Births  0 0 0 0 0     Health Maintenance: Pap:  05/28/18 neg: HR HPV neg History of abnormal Pap:  no MMG: 11/27/22 Breast Density Cat B, BI-RADS CAT 1 neg Colonoscopy:   [facility] Colonoscopy          Colonoscopy (Every 10 Years) Next due on 09/16/2031    09/15/2021  COLONOSCOPY   Only the first 1 history entries have been loaded, but more history exists.           BMD:  11/30/21  Result  osteopenic  HIV: 05/02/16 NR Hep C: 05/02/16 neg  Immunization History  Administered Date(s) Administered   Zoster Recombinant(Shingrix) 05/30/2017, 08/01/2017     reports that she has never smoked. She has never used smokeless tobacco. She reports current alcohol use of about 1.0 standard drink of alcohol per week. She reports that she does not use drugs.  Past Medical History:  Diagnosis Date   Allergy 09/01/2021   fall- seasonal   Class 1 obesity due to excess calories with serious comorbidity and body mass index (BMI) of 33.0 to 33.9 in adult 05/31/2021   Class 1 obesity due to excess calories with serious comorbidity and body mass index (BMI) of  34.0 to 34.9 in adult 10/08/2022   Depression    Dysmenorrhea    Elevated coronary artery calcium score 03/17/2021   Encounter for general adult medical examination without abnormal findings 10/07/2022   Encounter for screening mammogram for malignant neoplasm of breast 10/08/2022   Family history of heart disease 01/09/2022   Hyperlipidemia 03/17/2021   Mild recurrent major depression (HCC) 03/01/2022   Myalgia due to statin 01/09/2022   Prediabetes 05/31/2021   Urinary urgency 10/07/2022    Past Surgical History:  Procedure Laterality Date   BREAST EXCISIONAL BIOPSY Left 1998   Fibroadenoma   BREAST SURGERY Left 09/1996   fibroadenema   COLONOSCOPY  05/2011    Current Outpatient Medications  Medication Sig Dispense Refill   acyclovir (ZOVIRAX) 400 MG tablet Two tablet daily for HSV suppression 180 tablet 4   ALPRAZolam (XANAX) 1 MG tablet Take 1 mg by mouth at bedtime as needed for anxiety.     AMBIEN 10 MG tablet Take 5-10 mg by mouth at bedtime as needed for sleep.     B Complex Vitamins (VITAMIN-B COMPLEX PO) Take 1 capsule by mouth daily.     buPROPion (WELLBUTRIN) 75 MG tablet Take 75 mg by mouth every morning.     Cholecalciferol (D3 PO)  Take 1 tablet by mouth daily.     co-enzyme Q-10 30 MG capsule Take 30 mg by mouth 3 (three) times daily.     metFORMIN (GLUCOPHAGE-XR) 500 MG 24 hr tablet SMARTSIG:2 Tablet(s) By Mouth Every Evening     NONFORMULARY OR COMPOUNDED ITEM Progesterone     NONFORMULARY OR COMPOUNDED ITEM Pellet of estrogen and testosterone     Zinc Acetate, Oral, (ZINC ACETATE PO) Take 1 tablet by mouth daily.     No current facility-administered medications for this visit.    Family History  Problem Relation Age of Onset   Diabetes Mother 32       Whipple Procedure from cyst on pancrease   Heart failure Father    Breast cancer Maternal Aunt 58       with metasis   Diabetes Maternal Grandmother    Stroke Paternal Grandmother    Heart failure  Paternal Grandfather    Colon cancer Neg Hx    Colon polyps Neg Hx    Esophageal cancer Neg Hx    Rectal cancer Neg Hx    Stomach cancer Neg Hx     Review of Systems  All other systems reviewed and are negative.   Exam:   BP 126/74 (BP Location: Left Arm, Patient Position: Sitting, Cuff Size: Normal)   Ht 5\' 4"  (1.626 m)   Wt 194 lb (88 kg)   LMP 07/21/2014   BMI 33.30 kg/m     General appearance: alert, cooperative and appears stated age Head: normocephalic, without obvious abnormality, atraumatic Neck: no adenopathy, supple, symmetrical, trachea midline and thyroid normal to inspection and palpation Lungs: clear to auscultation bilaterally Breasts: normal appearance, no masses or tenderness, No nipple retraction or dimpling, No nipple discharge or bleeding, No axillary adenopathy Heart: regular rate and rhythm Abdomen: soft, non-tender; no masses, no organomegaly Extremities: extremities normal, atraumatic, no cyanosis or edema Skin: skin color, texture, turgor normal. No rashes or lesions Lymph nodes: cervical, supraclavicular, and axillary nodes normal. Neurologic: grossly normal  Pelvic: External genitalia:  no lesions              No abnormal inguinal nodes palpated.              Urethra:  normal appearing urethra with no masses, tenderness or lesions              Bartholins and Skenes: normal                 Vagina: normal appearing vagina with normal color and discharge, no lesions              Cervix: no lesions              Pap taken:  yes Bimanual Exam:  Uterus:  normal size, contour, position, consistency, mobility, non-tender              Adnexa: no mass, fullness, tenderness              Rectal exam: yes.  Confirms.              Anus:  normal sphincter tone, no lesions  Chaperone was present for exam:  Warren Lacy, CMA  Assessment:  Well woman with GYN exam.  Cervical cancer screening.  On HRT - testosterone/estrogen and progesterone.  Integrative  provider. Hx HSV.  Osteopenia. Elevated cholesterol.  FH CAD.   Plan: Mammogram screening discussed. Self breast awareness reviewed. Guidelines for Calcium, Vitamin D, regular exercise program  including cardiovascular and weight bearing exercise. Pap and HR HPV collected.  Refill of Acyclovir.  Discused WHI and use of HRT which can increase risk of PE, DVT, MI, stroke and breast cancer.  We reviewed benefits of reducing risk of colon cancer and osteoporosis.  BMD in 2025.  Labs from her integrative provider will be scanned in to Epic.  Fu yearly and prn.

## 2023-06-27 DIAGNOSIS — N951 Menopausal and female climacteric states: Secondary | ICD-10-CM | POA: Diagnosis not present

## 2023-06-27 DIAGNOSIS — Z79890 Hormone replacement therapy (postmenopausal): Secondary | ICD-10-CM | POA: Diagnosis not present

## 2023-06-28 ENCOUNTER — Other Ambulatory Visit: Payer: Self-pay | Admitting: Family Medicine

## 2023-06-28 DIAGNOSIS — R7303 Prediabetes: Secondary | ICD-10-CM

## 2023-07-04 ENCOUNTER — Telehealth: Payer: Self-pay | Admitting: Cardiology

## 2023-07-04 ENCOUNTER — Ambulatory Visit (INDEPENDENT_AMBULATORY_CARE_PROVIDER_SITE_OTHER): Payer: BC Managed Care – PPO | Admitting: Obstetrics and Gynecology

## 2023-07-04 ENCOUNTER — Other Ambulatory Visit (HOSPITAL_COMMUNITY)
Admission: RE | Admit: 2023-07-04 | Discharge: 2023-07-04 | Disposition: A | Discharge status: Home / Self Care | Payer: BC Managed Care – PPO | Source: Ambulatory Visit | Attending: Obstetrics and Gynecology | Admitting: Obstetrics and Gynecology

## 2023-07-04 ENCOUNTER — Encounter: Payer: Self-pay | Admitting: Obstetrics and Gynecology

## 2023-07-04 VITALS — BP 126/74 | Ht 64.0 in | Wt 194.0 lb

## 2023-07-04 DIAGNOSIS — Z8619 Personal history of other infectious and parasitic diseases: Secondary | ICD-10-CM

## 2023-07-04 DIAGNOSIS — Z01419 Encounter for gynecological examination (general) (routine) without abnormal findings: Secondary | ICD-10-CM | POA: Diagnosis not present

## 2023-07-04 DIAGNOSIS — B009 Herpesviral infection, unspecified: Secondary | ICD-10-CM | POA: Diagnosis not present

## 2023-07-04 DIAGNOSIS — Z124 Encounter for screening for malignant neoplasm of cervix: Secondary | ICD-10-CM | POA: Insufficient documentation

## 2023-07-04 MED ORDER — ACYCLOVIR 400 MG PO TABS
ORAL_TABLET | ORAL | 4 refills | Status: DC
Start: 2023-07-04 — End: 2023-08-11

## 2023-07-04 NOTE — Telephone Encounter (Signed)
No objections

## 2023-07-04 NOTE — Patient Instructions (Signed)

## 2023-07-04 NOTE — Telephone Encounter (Signed)
  The patient is requesting to switch from Dr. Tomie China to Dr. Royann Shivers because she works at [employer] and it will be easier for her to go there for an appointment

## 2023-07-05 NOTE — Telephone Encounter (Signed)
  Called pt to advised provider switch is approved. Left detailed message

## 2023-07-06 LAB — CYTOLOGY - PAP
Adequacy: ABSENT
Comment: NEGATIVE
Diagnosis: NEGATIVE
High risk HPV: NEGATIVE

## 2023-07-17 DIAGNOSIS — E039 Hypothyroidism, unspecified: Secondary | ICD-10-CM | POA: Diagnosis not present

## 2023-07-17 DIAGNOSIS — R739 Hyperglycemia, unspecified: Secondary | ICD-10-CM | POA: Diagnosis not present

## 2023-07-17 DIAGNOSIS — E559 Vitamin D deficiency, unspecified: Secondary | ICD-10-CM | POA: Diagnosis not present

## 2023-07-17 DIAGNOSIS — M859 Disorder of bone density and structure, unspecified: Secondary | ICD-10-CM | POA: Diagnosis not present

## 2023-07-17 DIAGNOSIS — N951 Menopausal and female climacteric states: Secondary | ICD-10-CM | POA: Diagnosis not present

## 2023-07-17 DIAGNOSIS — Z79890 Hormone replacement therapy (postmenopausal): Secondary | ICD-10-CM | POA: Diagnosis not present

## 2023-07-17 DIAGNOSIS — E7211 Homocystinuria: Secondary | ICD-10-CM | POA: Diagnosis not present

## 2023-08-01 DIAGNOSIS — L821 Other seborrheic keratosis: Secondary | ICD-10-CM | POA: Diagnosis not present

## 2023-08-01 DIAGNOSIS — D225 Melanocytic nevi of trunk: Secondary | ICD-10-CM | POA: Diagnosis not present

## 2023-08-01 DIAGNOSIS — L814 Other melanin hyperpigmentation: Secondary | ICD-10-CM | POA: Diagnosis not present

## 2023-08-06 ENCOUNTER — Ambulatory Visit: Payer: BC Managed Care – PPO | Attending: Cardiovascular Disease | Admitting: Cardiovascular Disease

## 2023-08-06 ENCOUNTER — Encounter: Payer: Self-pay | Admitting: Cardiovascular Disease

## 2023-08-06 VITALS — BP 108/76 | HR 87 | Ht 64.0 in | Wt 195.0 lb

## 2023-08-06 DIAGNOSIS — I2510 Atherosclerotic heart disease of native coronary artery without angina pectoris: Secondary | ICD-10-CM

## 2023-08-06 DIAGNOSIS — E669 Obesity, unspecified: Secondary | ICD-10-CM

## 2023-08-06 DIAGNOSIS — G720 Drug-induced myopathy: Secondary | ICD-10-CM | POA: Diagnosis not present

## 2023-08-06 DIAGNOSIS — T466X5D Adverse effect of antihyperlipidemic and antiarteriosclerotic drugs, subsequent encounter: Secondary | ICD-10-CM

## 2023-08-06 DIAGNOSIS — R931 Abnormal findings on diagnostic imaging of heart and coronary circulation: Secondary | ICD-10-CM | POA: Diagnosis not present

## 2023-08-06 DIAGNOSIS — R7303 Prediabetes: Secondary | ICD-10-CM

## 2023-08-06 NOTE — Progress Notes (Signed)
Elevated coronary calcium score Cardiology Office Note:    Date:  08/11/2023   ID:  Shelby Barnes, DOB 1961-03-18, MRN [id number]  PCP:  Blane Ohara, MD   [facility] Providers Cardiologist:  None     Referring MD: No ref. provider found   Chief Complaint  Patient presents with   Elevated coronary calcium score  Transitioning care from Dr. Tomie China  History of Present Illness:    Shelby Barnes is a 62 y.o. female with a hx of mild hypercholesterolemia, prediabetes, mild obesity, anxiety, family history of early onset vascular disease, statin myopathy transitioning care from Dr. Tomie China in [place].  Shelby Barnes does not have complaints of angina, focal neurological complaints or claudication and does not have clinically established CAD or PAD.  She reports that she had a coronary calcium score of 24 in the recent past (at age 48 this would place her in the 71st percentile).  Her father died of a myocardial infarction at age 56 and her paternal grandmother had a stroke at age 64.  She does not smoke.  She describes herself as active.  She exercises for 15-20 minutes on the treadmill 3 days a week and walks quite a bit at work (about 5000 steps each day).  She has mild hypercholesterolemia.in May labs showed a total cholesterol of 225 and an LDL cholesterol of 143.  Her HDL is quite good at 68 and triglycerides are normal.  She had even more recent labs in August with an LDL cholesterol 165 (other values not documented).  She has tried taking numerous statins including rosuvastatin and atorvastatin and has developed intolerable muscle aches.  Atorvastatin was a little better but eventually she had to stop this to because of muscle pain.  2 years ago she tried to take Repatha, but even this caused muscle discomfort.  Similar complaints occurred with Nexletol and ezetimibe.  She is mildly obese with a BMI of 33.  She has prediabetes with a hemoglobin A1c just 2 weeks ago at  5.8% (the highest has been recorded at 6.0%).  She does not have hypertension.    Past Medical History:  Diagnosis Date   Allergy 09/01/2021   fall- seasonal   Class 1 obesity due to excess calories with serious comorbidity and body mass index (BMI) of 33.0 to 33.9 in adult 05/31/2021   Class 1 obesity due to excess calories with serious comorbidity and body mass index (BMI) of 34.0 to 34.9 in adult 10/08/2022   Depression    Dysmenorrhea    Elevated coronary artery calcium score 03/17/2021   Encounter for general adult medical examination without abnormal findings 10/07/2022   Encounter for screening mammogram for malignant neoplasm of breast 10/08/2022   Family history of heart disease 01/09/2022   Hyperlipidemia 03/17/2021   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   AMBIEN 10 MG tablet Take 5-10 mg by mouth at bedtime as needed for sleep.   B Complex Vitamins (VITAMIN-B COMPLEX PO) Take 1 capsule by mouth daily.   buPROPion (  WELLBUTRIN) 75 MG tablet Take 75 mg by mouth every morning.   Cholecalciferol (D3 PO) Take 1 tablet by mouth daily.   co-enzyme Q-10 30 MG capsule Take 30 mg by mouth 3 (three) times daily.   metFORMIN (GLUCOPHAGE-XR) 500 MG 24 hr tablet SMARTSIG:2 Tablet(s) By Mouth Every Evening   NONFORMULARY OR COMPOUNDED ITEM Progesterone   NONFORMULARY OR COMPOUNDED ITEM Pellet of estrogen and testosterone   Zinc Acetate, Oral, (ZINC ACETATE PO) Take 1 tablet by mouth daily.     Allergies:   Lipitor [atorvastatin], Amoxicillin-pot clavulanate, Codeine,  Crestor [rosuvastatin], Hydrocodone, Nexletol [bempedoic acid], Repatha [evolocumab], and Zetia [ezetimibe]   Social History   Socioeconomic History   Marital status: Divorced    Spouse name: Not on file   Number of children: 0   Years of education: Not on file   Highest education level: Not on file  Occupational History   Occupation: Barista  Tobacco Use   Smoking status: Never   Smokeless tobacco: Never  Vaping Use   Vaping status: Never Used  Substance and Sexual Activity   Alcohol use: Yes    Alcohol/week: 1.0 standard drink of alcohol    Types: 1 Standard drinks or equivalent per week    Comment: 2 drinks per month   Drug use: Never   Sexual activity: Not Currently    Partners: Male    Birth control/protection: Post-menopausal  Other Topics Concern   Not on file  Social History Narrative   Not on file   Social Drivers of Health   Financial Resource Strain: Low Risk  (10/06/2022)   Overall Financial Resource Strain (CARDIA)    Difficulty of Paying Living Expenses: Not hard at all  Food Insecurity: No Food Insecurity (10/06/2022)   Hunger Vital Sign    Worried About Running Out of Food in the Last Year: Never true    Ran Out of Food in the Last Year: Never true  Transportation Needs: No Transportation Needs (10/06/2022)   PRAPARE - Administrator, Civil Service (Medical): No    Lack of Transportation (Non-Medical): No  Physical Activity: Insufficiently Active (10/06/2022)   Exercise Vital Sign    Days of Exercise per Week: 3 days    Minutes of Exercise per Session: 20 min  Stress: No Stress Concern Present (10/06/2022)   [employer] of Occupational Health - Occupational Stress Questionnaire    Feeling of Stress : Not at all  Social Connections: Socially Isolated (10/06/2022)   Social Connection and Isolation Panel [NHANES]    Frequency of Communication with Friends and Family: Three times a week    Frequency of Social Gatherings  with Friends and Family: Never    Attends Religious Services: Never    Database administrator or Organizations: No    Attends Engineer, structural: Never    Marital Status: Never married     Family History: The patient's family history includes Breast cancer (age of onset: 52) in her maternal aunt; Diabetes in her maternal grandmother; Diabetes (age of onset: 28) in her mother; Heart failure in her father and paternal grandfather; Stroke in her paternal grandmother. There is no history of Colon cancer, Colon polyps, Esophageal cancer, Rectal cancer, or Stomach cancer.  ROS:   Please see the history of present illness.     All other systems reviewed and are negative.  EKGs/Labs/Other Studies Reviewed:    The following studies were reviewed today:  EKG Interpretation Date/Time:  Monday August 06 2023 13:57:30 EST Ventricular Rate:  87 PR Interval:  138 QRS Duration:  74 QT Interval:  370 QTC Calculation: 445 R Axis:   -18  Text Interpretation: Normal sinus rhythm Low voltage QRS Nonspecific ST and T wave abnormality No previous ECGs available Confirmed by Hermenia Fritcher [phone]) on 08/06/2023 2:10:27 PM        EKG Interpretation Date/Time:  Monday August 06 2023 13:57:30 EST Ventricular Rate:  87 PR Interval:  138 QRS Duration:  74 QT Interval:  370 QTC Calculation: 445 R Axis:   -18  Text Interpretation: Normal sinus rhythm Low voltage QRS Nonspecific ST and T wave abnormality No previous ECGs available Confirmed by Sadarius Norman ([id number]) on 08/06/2023 2:10:27 PM    Recent Labs: 12/08/2022: TSH 0.738 01/12/2023: ALT 8; BUN 11; Creatinine, Ser 0.91; Hemoglobin 14.9; Platelets 301; Potassium 4.3; Sodium 139  Recent Lipid Panel    Component Value Date/Time   CHOL 225 (H) 01/12/2023 1102   TRIG 82 01/12/2023 1102   HDL 68 01/12/2023 1102   CHOLHDL 3.3 01/12/2023 1102   LDLCALC 143 (H) 01/12/2023 1102     Risk Assessment/Calculations:             Physical Exam:    VS:  BP 108/76   Pulse 87   Ht 5\' 4"  (1.626 m)   Wt 195 lb (88.5 kg)   LMP 07/21/2014   SpO2 99%   BMI 33.47 kg/m     Wt Readings from Last 3 Encounters:  08/06/23 195 lb (88.5 kg)  07/04/23 194 lb (88 kg)  01/12/23 188 lb (85.3 kg)     GEN: Mildly obese well nourished, well developed in no acute distress HEENT: Normal NECK: No JVD; No carotid bruits LYMPHATICS: No lymphadenopathy CARDIAC: RRR, no murmurs, rubs, gallops RESPIRATORY:  Clear to auscultation without rales, wheezing or rhonchi  ABDOMEN: Soft, non-tender, non-distended MUSCULOSKELETAL:  No edema; No deformity  SKIN: Warm and dry NEUROLOGIC:  Alert and oriented x 3 PSYCHIATRIC:  Normal affect   ASSESSMENT:    1. Elevated coronary artery calcium score   2. Coronary artery disease involving native coronary artery of native heart without angina pectoris   3. Statin myopathy   4. Prediabetes   5. Mild obesity    PLAN:    In order of problems listed above:  Elevated coronary calcium score: For a 62 year old woman a coronary calcium score would be equivalent to the 71st percentile.  I am not sure exactly when the calcium score was performed but if she was younger, then she would be in an even higher percentile.  Together with her family history of early onset vascular problems, this leads to a clear recommendation for lipid-lowering therapy.  She is currently asymptomatic from a vascular point of view so we are still talking about primary prevention. Statin myopathy: Muscle aches documented with atorvastatin, rosuvastatin, but have a statin and she reports that she has tried other agents as well as some results.  Unfortunately she has had muscle aches with agents that are typically not associated with muscular side effect such as Repatha or Nexletol and even ezetimibe.  That would leave Leqvio as the only currently available lipid-lowering agent that she has not tried.  Will try to see if we can  get insurance approval for this.  If we cannot get her on Leqvio if she has side effects to his medication, referred to our lipid clinic to see if she can be enrolled in clinical trials  of a new agent. PreDM/mild obesity: In addition to medical therapy for her dyslipidemia she would benefit from more physical activity.  She is probably getting roughly half the recommended amount of physical activity.  Needs about 3 hours total of physical exercise each week, including 30 minutes of intense physical activity such as weightlifting, aerobics, etc.  Also discussed the glycemic index and the need to avoid not only sweets and sugary drinks but also to avoid starches with high glycemic index such as white bread/pasta/rice/potatoes.           Medication Adjustments/Labs and Tests Ordered: Current medicines are reviewed at length with the patient today.  Concerns regarding medicines are outlined above.  Orders Placed This Encounter  Procedures   EKG 12-Lead   No orders of the defined types were placed in this encounter.   Patient Instructions  Medication Instructions:  No changes *If you need a refill on your cardiac medications before your next appointment, please call your pharmacy*  Follow-Up: At [facility], you and your health needs are our priority.  As part of our continuing mission to provide you with exceptional heart care, we have created designated Provider Care Teams.  These Care Teams include your primary Cardiologist (physician) and Advanced Practice Providers (APPs -  Physician Assistants and Nurse Practitioners) who all work together to provide you with the care you need, when you need it.  We recommend signing up for the patient portal called "MyChart".  Sign up information is provided on this After Visit Summary.  MyChart is used to connect with patients for Virtual Visits (Telemedicine).  Patients are able to view lab/test results, encounter notes, upcoming appointments,  etc.  Non-urgent messages can be sent to your provider as well.   To learn more about what you can do with MyChart, go to [email or web address].    Your next appointment:   1 year(s)  Provider:   Dr Royann Shivers    Signed, Thurmon Fair, MD  08/11/2023 5:46 PM    [facility]

## 2023-08-06 NOTE — Patient Instructions (Signed)

## 2023-08-11 ENCOUNTER — Encounter: Payer: Self-pay | Admitting: Cardiovascular Disease

## 2023-08-13 ENCOUNTER — Telehealth: Payer: Self-pay | Admitting: Emergency Medicine

## 2023-08-13 DIAGNOSIS — E782 Mixed hyperlipidemia: Secondary | ICD-10-CM

## 2023-08-13 NOTE — Telephone Encounter (Signed)
Left message asking patient to call back to schedule an appointment with one of our Pharmacists to start on Leqvio. Left call back number- Informed of appointments available in February.    From: Scheryl Marten, RN  Sent: 08/06/2023   2:56 PM EST  To: Thurmon Fair, MD; Rosalee Kaufman, RPH-CPP; *  Subject: Is it possible to start this patient on Leqv*   Is it possible to start this patient on Leqvio? She has failed all of the statins and repatha. Dr C wanted to ask about possibly starting Leqvio. Thank you    Alvstad, [place] Carbo, RPH-CPP  Scheryl Marten, RN Yes, she has [employer], so copay should be low to $0.  Do you want to have her see [place] or just start the process?  (If see [place], please send to scheduling)

## 2023-08-31 DIAGNOSIS — F339 Major depressive disorder, recurrent, unspecified: Secondary | ICD-10-CM | POA: Diagnosis not present

## 2023-08-31 DIAGNOSIS — F411 Generalized anxiety disorder: Secondary | ICD-10-CM | POA: Diagnosis not present

## 2023-08-31 DIAGNOSIS — F5101 Primary insomnia: Secondary | ICD-10-CM | POA: Diagnosis not present

## 2023-09-18 DIAGNOSIS — R509 Fever, unspecified: Secondary | ICD-10-CM | POA: Diagnosis not present

## 2023-09-18 DIAGNOSIS — J101 Influenza due to other identified influenza virus with other respiratory manifestations: Secondary | ICD-10-CM | POA: Diagnosis not present

## 2023-09-18 DIAGNOSIS — R52 Pain, unspecified: Secondary | ICD-10-CM | POA: Diagnosis not present

## 2023-09-18 DIAGNOSIS — R5383 Other fatigue: Secondary | ICD-10-CM | POA: Diagnosis not present

## 2023-09-18 DIAGNOSIS — R051 Acute cough: Secondary | ICD-10-CM | POA: Diagnosis not present

## 2023-09-18 NOTE — Telephone Encounter (Signed)
Could you do PA for Leqvio to see if it would be covered on her plan

## 2023-09-19 ENCOUNTER — Encounter: Payer: Self-pay | Admitting: Family Medicine

## 2023-09-19 ENCOUNTER — Telehealth: Payer: Self-pay

## 2023-09-19 NOTE — Telephone Encounter (Addendum)
Belenda Cruise, I have submitted the auth and will f/u once I have a response.  Selena Batten

## 2023-09-19 NOTE — Telephone Encounter (Signed)
Copied from CRM [id number]. Topic: Clinical - Medical Advice >> Sep 19, 2023 12:17 PM Ivette P wrote: Reason for CRM: Pt is calling in because was diagnosed with INFLUENZA A and would like medication to help, pt has already attempted over the counter medicine but is not working. Pt callback [phone]   Spoke with patient, she states [facility], she states they gave her for relief and patient is wanting to know can we send her something in for her cough so can sleep?

## 2023-09-20 ENCOUNTER — Telehealth: Payer: Self-pay

## 2023-09-20 ENCOUNTER — Encounter: Payer: Self-pay | Admitting: Family Medicine

## 2023-09-20 ENCOUNTER — Other Ambulatory Visit: Payer: Self-pay | Admitting: Family Medicine

## 2023-09-20 MED ORDER — HYDROCODONE BIT-HOMATROP MBR 5-1.5 MG/5ML PO SOLN: 5.0000 mL | Freq: Four times a day (QID) | ORAL | 0 refills | Status: AC | PRN

## 2023-09-20 NOTE — Telephone Encounter (Signed)
Patient states she has been to urgent care, and states she does not want the pearls as they do not help, hydrocodone cough medicine has helped in the past , can we send that for her? Patient states when she coughs she cannot get anything up, but care hear stuff in chest. Patient states she has been sick since Saturday and she states she is not sleeping at all due the cough. Urgent care told her she has flu A.  Copied from CRM [id number]. Topic: Clinical - Medical Advice >> Sep 20, 2023  8:09 AM Fonda Kinder J wrote: Reason for CRM: Pt states she asked for a callback yesterday and only received a vague message from Dr.Cox. She states her answer is unacceptable because she has already done what Dr. Sedalia Muta has recommended and she is still not better. She is requesting a call back and states she doesn't want to receive a my chart message but wants to verbally speak to someone  Callback # [phone]

## 2023-09-26 ENCOUNTER — Other Ambulatory Visit (HOSPITAL_COMMUNITY): Payer: Self-pay

## 2023-09-26 ENCOUNTER — Telehealth: Payer: Self-pay | Admitting: Pharmacy Technician

## 2023-09-26 NOTE — Telephone Encounter (Signed)
 Both require a prior authorization. I asked kristin rph which one she prefers since the leqvio was denied

## 2023-09-26 NOTE — Telephone Encounter (Signed)
 Shelby Barnes, Leqvio has been denied due to patient has not tried preferred medication. 1. Repath 2. Praluent  We will d/c the treatment plan. Denial letter has been scanned to the media tab for your reiview or if you would like to appeal.  Shelby Barnes

## 2023-10-02 ENCOUNTER — Telehealth: Payer: Self-pay | Admitting: Pharmacy Technician

## 2023-10-02 ENCOUNTER — Other Ambulatory Visit (HOSPITAL_COMMUNITY): Payer: Self-pay

## 2023-10-02 NOTE — Telephone Encounter (Signed)
Sent Provider Courtesy Review to [employer].  Was told they will respond in 7 business days

## 2023-10-02 NOTE — Telephone Encounter (Signed)
Pharmacy Patient Advocate Encounter   Received notification from Pt Calls Messages that prior authorization for repatha is required/requested.   Insurance verification completed.   The patient is insured through [facility] .   Per test claim: PA required; PA submitted to above mentioned insurance via CoverMyMeds Key/confirmation #/EOC [id number] Status is pending   I tried to send on blue -e and it wouldn't go

## 2023-10-02 NOTE — Telephone Encounter (Signed)
Pharmacy Patient Advocate Encounter  Received notification from [facility] that Prior Authorization for repatha has been APPROVED from 10/02/23 to 10/01/24. Ran test claim, Copay is $60.00- one month. This test claim was processed through [facility]- copay amounts may vary at other pharmacies due to pharmacy/plan contracts, or as the patient moves through the different stages of their insurance plan.   PA #/Case ID/Reference #: [id number]

## 2023-10-05 NOTE — Telephone Encounter (Signed)
Courtesy review is for [facility], patient has already failed Repatha

## 2023-10-08 ENCOUNTER — Telehealth: Payer: Self-pay | Admitting: Pharmacy Technician

## 2023-10-08 NOTE — Telephone Encounter (Addendum)
 Shelby Barnes has been approved and patient will be scheduled as soon as possible.  Auth Submission: APPROVED DENIAL HAS BEEN OVER-TURNED  Site of care: Site of care: [facility] Payer: BCBS Medication & CPT/J Code(s) submitted: Leqvio (Inclisiran) [phone] Route of submission (phone, fax, portal):  Phone # Fax # Auth type: Buy/Bill PB Units/visits requested: 3 DOSES Reference number: [id number] Approval from: 09/19/23 to 09/17/24   Co pay card: Pending [employer] aware

## 2023-10-08 NOTE — Telephone Encounter (Signed)
 Shelby Barnes, FYIWilber Bihari denial has been over-tunred and approved. Approval has been faxed to the media tab for your review.  For Clarification: Will the patient be getting Repatha or Leqvio?

## 2023-10-09 ENCOUNTER — Other Ambulatory Visit: Payer: Self-pay

## 2023-10-09 ENCOUNTER — Telehealth: Payer: Self-pay

## 2023-10-09 NOTE — Telephone Encounter (Signed)
 Belenda Cruise, FYIWilber Bihari denial has been over-tunred and approved. Approval has been faxed to the media tab for your review.   For Clarification: Will the patient be getting Repatha or Leqvio?      Note   You  Alvstad, Kristin L, RPH-CPP7 days ago    PA request has been Submitted. New Encounter created for follow up. For additional info see Pharmacy Prior Auth telephone encounter from 10/02/23.   Rosalee Kaufman, RPH-CPP  Rx Prior Auth Team7 days ago    Repatha 140 mg please   You  Alvstad, [place] Carbo, RPH-CPP13 days ago    I went ahead and tried repatha and praluent in test claims and they both require a prior auth. Let me know if you want me to start a prior auth and which one. Thank you   Amalia Greenhouse, CPhT  You; Rosalee Kaufman, RPH-CPP13 days ago    Matthias Hughs has been denied due to patient has not tried preferred medication. 1. Repath 2. Praluent   We will d/c the treatment plan. Denial letter has been scanned to the media tab for your reiview or if you would like to appeal.   Selena Batten

## 2023-10-09 NOTE — Telephone Encounter (Signed)
 Auth Submission: APPROVED Site of care: Site of care: [facility] Payer: BCBS commercial Medication & CPT/J Code(s) submitted: Leqvio (Inclisiran) [id number] Route of submission (phone, fax, portal): fax Phone # Fax # [phone]  Auth type: Buy/Bill PB Units/visits requested: 284mg  x 3 doses Reference number: [id number] Approval from: 09/19/23 to 09/17/24  Approval letter in media tab

## 2023-10-12 ENCOUNTER — Encounter: Payer: BC Managed Care – PPO | Admitting: Family Medicine

## 2023-11-21 DIAGNOSIS — Z79890 Hormone replacement therapy (postmenopausal): Secondary | ICD-10-CM | POA: Diagnosis not present

## 2023-11-21 DIAGNOSIS — M810 Age-related osteoporosis without current pathological fracture: Secondary | ICD-10-CM | POA: Diagnosis not present

## 2023-11-21 DIAGNOSIS — N951 Menopausal and female climacteric states: Secondary | ICD-10-CM | POA: Diagnosis not present

## 2023-11-23 DIAGNOSIS — F339 Major depressive disorder, recurrent, unspecified: Secondary | ICD-10-CM | POA: Diagnosis not present

## 2023-11-23 DIAGNOSIS — F5101 Primary insomnia: Secondary | ICD-10-CM | POA: Diagnosis not present

## 2023-11-23 DIAGNOSIS — F411 Generalized anxiety disorder: Secondary | ICD-10-CM | POA: Diagnosis not present

## 2023-12-03 DIAGNOSIS — M25551 Pain in right hip: Secondary | ICD-10-CM | POA: Diagnosis not present

## 2024-01-06 DIAGNOSIS — R079 Chest pain, unspecified: Secondary | ICD-10-CM | POA: Diagnosis not present

## 2024-01-06 DIAGNOSIS — E876 Hypokalemia: Secondary | ICD-10-CM | POA: Diagnosis not present

## 2024-01-06 DIAGNOSIS — R9431 Abnormal electrocardiogram [ECG] [EKG]: Secondary | ICD-10-CM | POA: Diagnosis not present

## 2024-01-23 DIAGNOSIS — Z79890 Hormone replacement therapy (postmenopausal): Secondary | ICD-10-CM | POA: Diagnosis not present

## 2024-01-23 DIAGNOSIS — N951 Menopausal and female climacteric states: Secondary | ICD-10-CM | POA: Diagnosis not present

## 2024-02-12 DIAGNOSIS — Z0000 Encounter for general adult medical examination without abnormal findings: Secondary | ICD-10-CM | POA: Diagnosis not present

## 2024-02-13 DIAGNOSIS — R7303 Prediabetes: Secondary | ICD-10-CM | POA: Diagnosis not present

## 2024-02-13 DIAGNOSIS — I2510 Atherosclerotic heart disease of native coronary artery without angina pectoris: Secondary | ICD-10-CM | POA: Diagnosis not present

## 2024-02-13 LAB — LAB REPORT - SCANNED
A1c: 5.6
EGFR: 79

## 2024-02-15 DIAGNOSIS — F5101 Primary insomnia: Secondary | ICD-10-CM | POA: Diagnosis not present

## 2024-02-15 DIAGNOSIS — F339 Major depressive disorder, recurrent, unspecified: Secondary | ICD-10-CM | POA: Diagnosis not present

## 2024-02-15 DIAGNOSIS — F411 Generalized anxiety disorder: Secondary | ICD-10-CM | POA: Diagnosis not present

## 2024-02-18 ENCOUNTER — Encounter: Payer: Self-pay | Admitting: Cardiovascular Disease

## 2024-02-18 DIAGNOSIS — Z789 Other specified health status: Secondary | ICD-10-CM

## 2024-02-18 DIAGNOSIS — R931 Abnormal findings on diagnostic imaging of heart and coronary circulation: Secondary | ICD-10-CM

## 2024-02-18 DIAGNOSIS — E7841 Elevated Lipoprotein(a): Secondary | ICD-10-CM

## 2024-02-18 DIAGNOSIS — I2510 Atherosclerotic heart disease of native coronary artery without angina pectoris: Secondary | ICD-10-CM

## 2024-02-18 DIAGNOSIS — E782 Mixed hyperlipidemia: Secondary | ICD-10-CM

## 2024-02-18 NOTE — Telephone Encounter (Signed)
 Please refer to lipid clinic, Dr. Mona for elevated LP(a) and elevated coronary calcium  score

## 2024-02-26 DIAGNOSIS — E7800 Pure hypercholesterolemia, unspecified: Secondary | ICD-10-CM | POA: Diagnosis not present

## 2024-02-26 DIAGNOSIS — I2510 Atherosclerotic heart disease of native coronary artery without angina pectoris: Secondary | ICD-10-CM | POA: Diagnosis not present

## 2024-02-26 DIAGNOSIS — E7841 Elevated Lipoprotein(a): Secondary | ICD-10-CM | POA: Diagnosis not present

## 2024-04-02 DIAGNOSIS — Z79890 Hormone replacement therapy (postmenopausal): Secondary | ICD-10-CM | POA: Diagnosis not present

## 2024-04-02 DIAGNOSIS — N951 Menopausal and female climacteric states: Secondary | ICD-10-CM | POA: Diagnosis not present

## 2024-04-25 DIAGNOSIS — F411 Generalized anxiety disorder: Secondary | ICD-10-CM | POA: Diagnosis not present

## 2024-04-25 DIAGNOSIS — F5101 Primary insomnia: Secondary | ICD-10-CM | POA: Diagnosis not present

## 2024-04-25 DIAGNOSIS — F339 Major depressive disorder, recurrent, unspecified: Secondary | ICD-10-CM | POA: Diagnosis not present

## 2024-05-15 DIAGNOSIS — R7303 Prediabetes: Secondary | ICD-10-CM | POA: Diagnosis not present

## 2024-05-15 DIAGNOSIS — E7800 Pure hypercholesterolemia, unspecified: Secondary | ICD-10-CM | POA: Diagnosis not present

## 2024-05-20 ENCOUNTER — Institutional Professional Consult (permissible substitution) (HOSPITAL_BASED_OUTPATIENT_CLINIC_OR_DEPARTMENT_OTHER): Admitting: Internal Medicine

## 2024-05-21 DIAGNOSIS — N951 Menopausal and female climacteric states: Secondary | ICD-10-CM | POA: Diagnosis not present

## 2024-05-21 DIAGNOSIS — Z79890 Hormone replacement therapy (postmenopausal): Secondary | ICD-10-CM | POA: Diagnosis not present

## 2024-06-18 DIAGNOSIS — M7061 Trochanteric bursitis, right hip: Secondary | ICD-10-CM | POA: Diagnosis not present

## 2024-06-20 DIAGNOSIS — F339 Major depressive disorder, recurrent, unspecified: Secondary | ICD-10-CM | POA: Diagnosis not present

## 2024-06-20 DIAGNOSIS — F411 Generalized anxiety disorder: Secondary | ICD-10-CM | POA: Diagnosis not present

## 2024-06-20 DIAGNOSIS — F5101 Primary insomnia: Secondary | ICD-10-CM | POA: Diagnosis not present

## 2024-07-07 ENCOUNTER — Ambulatory Visit: Payer: BC Managed Care – PPO | Admitting: Obstetrics and Gynecology

## 2024-08-04 ENCOUNTER — Ambulatory Visit: Attending: Cardiovascular Disease | Admitting: Cardiovascular Disease

## 2024-08-04 ENCOUNTER — Encounter: Payer: Self-pay | Admitting: Cardiovascular Disease

## 2024-08-04 VITALS — BP 121/84 | HR 88 | Ht 64.0 in | Wt 173.8 lb

## 2024-08-04 DIAGNOSIS — R931 Abnormal findings on diagnostic imaging of heart and coronary circulation: Secondary | ICD-10-CM

## 2024-08-04 DIAGNOSIS — G720 Drug-induced myopathy: Secondary | ICD-10-CM | POA: Diagnosis not present

## 2024-08-04 DIAGNOSIS — Z789 Other specified health status: Secondary | ICD-10-CM

## 2024-08-04 DIAGNOSIS — E663 Overweight: Secondary | ICD-10-CM | POA: Diagnosis not present

## 2024-08-04 DIAGNOSIS — I2510 Atherosclerotic heart disease of native coronary artery without angina pectoris: Secondary | ICD-10-CM

## 2024-08-04 DIAGNOSIS — E782 Mixed hyperlipidemia: Secondary | ICD-10-CM | POA: Diagnosis not present

## 2024-08-04 DIAGNOSIS — T466X5A Adverse effect of antihyperlipidemic and antiarteriosclerotic drugs, initial encounter: Secondary | ICD-10-CM

## 2024-08-04 NOTE — Progress Notes (Unsigned)
 Elevated coronary calcium  score Cardiology Office Note:    Date:  08/09/2024   ID:  Shelby Barnes, DOB 12/16/1960, MRN [id number]  PCP:  Sherre Clapper, MD   [facility] Providers Cardiologist:  None     Referring MD: Sherre Clapper, MD   Chief Complaint  Patient presents with   Hyperlipidemia    History of Present Illness:    Shelby Barnes is a 63 y.o. female with a hx of mild hypercholesterolemia, but severely elevated LP(a), prediabetes, mild obesity, anxiety, family history of early onset vascular disease, statin myopathy returning for follow-up.  She denies angina or dyspnea.  Walks her [employer] every day, walks at work and on a treadmill as well.  Denies edema, claudication, focal neurological complaints, palpitations, dizziness, syncope or lower extremity edema.  She reports that she had a coronary calcium  score of 24 in the recent past (at age 31 this would place her in the 71st percentile).  Her father died of a myocardial infarction at age 61 and her paternal grandmother had a stroke at age 13.  She does not smoke.  She has lost weight and is just underneath the threshold for obesity with a BMI of 29.8, down from a BMI of 33 last year.  Her hemoglobin A1c has completely normalized at 5.4% (previously 5.8-6.0%)  Her most recent lipid profile after starting Leqvio showed marked improvement with an LDL cholesterol of 65, down from 143 and a total cholesterol of 137, down from 225.  She continues to have a good HDL cholesterol at 53 and normal triglycerides.  She does not have diabetes mellitus.  Despite Leqvio her LP(a) has not improved (essentially stationary going from 219-230).  She has tried taking numerous statins including rosuvastatin and atorvastatin  and has developed intolerable muscle aches.  Atorvastatin  was a little better but eventually she had to stop this to because of muscle pain. Similar complaints occurred with Nexletol  and ezetimibe .   Past Medical  History:  Diagnosis Date   Allergy 09/01/2021   fall- seasonal   Class 1 obesity due to excess calories with serious comorbidity and body mass index (BMI) of 33.0 to 33.9 in adult 05/31/2021   Class 1 obesity due to excess calories with serious comorbidity and body mass index (BMI) of 34.0 to 34.9 in adult 10/08/2022   Depression    Dysmenorrhea    Elevated coronary artery calcium  score 03/17/2021   Encounter for general adult medical examination without abnormal findings 10/07/2022   Encounter for screening mammogram for malignant neoplasm of breast 10/08/2022   Family history of heart disease 01/09/2022   Hyperlipidemia 03/17/2021   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   AMBIEN 10 MG tablet Take 5-10 mg by mouth at bedtime as needed for sleep.   B Complex Vitamins (VITAMIN-B COMPLEX PO) Take 1 capsule by mouth daily.   Cholecalciferol (D3 PO) Take 1 tablet by mouth daily.   co-enzyme Q-10 30 MG capsule Take 30 mg by mouth 3 (three) times daily.   diclofenac (VOLTAREN) 75 MG  EC tablet Take 75 mg by mouth 2 (two) times daily.   estradiol  (VIVELLE -DOT) 0.05 MG/24HR patch Place 1 patch onto the skin 2 (two) times a week.   HYDROcodone  bit-homatropine (HYDROMET) 5-1.5 MG/5ML syrup Take 5 mLs by mouth every 6 (six) hours as needed for cough.   NONFORMULARY OR COMPOUNDED ITEM Progesterone    progesterone  (PROMETRIUM ) 100 MG capsule Take 300 mg by mouth at bedtime.   REPATHA  SURECLICK 140 MG/ML SOAJ Inject 1 mL into the skin every 14 (fourteen) days.   Zinc  Acetate, Oral, (ZINC   ACETATE PO) Take 1 tablet by mouth daily.     Allergies:   Lipitor [atorvastatin ], Amoxicillin -pot clavulanate, Codeine, Crestor [rosuvastatin], Nexletol  [bempedoic acid ], Repatha  [evolocumab ], and Zetia  [ezetimibe ]       Family History: The patient's family history includes Breast cancer (age of onset: 34) in her maternal aunt; Diabetes in her maternal grandmother; Diabetes (age of onset: 59) in her mother; Heart failure in her father and paternal grandfather; Stroke in her paternal grandmother. There is no history of Colon cancer, Colon polyps, Esophageal cancer, Rectal cancer, or Stomach cancer.  ROS:   Please see the history of present illness.     All other systems reviewed and are negative.  EKGs/Labs/Other Studies Reviewed:    The following studies were reviewed today:  EKG Interpretation Date/Time:  Monday August 04 2024 15:25:22 EST Ventricular Rate:  88 PR Interval:  134 QRS Duration:  74 QT Interval:  396 QTC Calculation: 479 R Axis:   -13  Text Interpretation: Normal sinus rhythm Low voltage QRS When compared with ECG of 06-Aug-2023 13:57, No significant change was found Confirmed by Corazon Nickolas ([id number]) on 08/04/2024 3:50:55 PM        Recent Labs: No results found for requested labs within last 365 days.  Recent Lipid Panel    Component Value Date/Time   CHOL 225 (H) 01/12/2023 1102   TRIG 82 01/12/2023 1102   HDL 68 01/12/2023 1102   CHOLHDL 3.3 01/12/2023 1102   LDLCALC 143 (H) 01/12/2023 1102     Risk Assessment/Calculations:            Physical Exam:    VS:  BP 121/84 (BP Location: Left Arm, Patient Position: Sitting, Cuff Size: Large)   Pulse 88   Ht 5' 4 (1.626 m)   Wt 173 lb 12.8 oz (78.8 kg)   LMP 07/21/2014   SpO2 100%   BMI 29.83 kg/m     Wt Readings from Last 3 Encounters:  08/04/24 173 lb 12.8 oz (78.8 kg)  08/06/23 195 lb (88.5 kg)  07/04/23 194 lb (88 kg)      General: Alert, oriented x3, no distress,  overweight Head: no evidence of trauma, PERRL, EOMI, no exophtalmos or lid lag, no myxedema, no xanthelasma; normal ears, nose and oropharynx Neck: normal jugular venous pulsations and no hepatojugular reflux; brisk carotid pulses without delay and no carotid bruits Chest: clear to auscultation, no signs of consolidation by percussion or palpation, normal fremitus, symmetrical and full respiratory excursions Cardiovascular: normal position and quality of the apical impulse, regular rhythm, normal first and second heart sounds, no murmurs, rubs or gallops Abdomen: no tenderness or distention, no masses by palpation, no abnormal pulsatility or arterial bruits, normal bowel sounds, no hepatosplenomegaly Extremities: no clubbing, cyanosis or edema; 2+ radial, ulnar and brachial pulses bilaterally; 2+ right femoral, posterior tibial and dorsalis pedis pulses; 2+ left femoral, posterior tibial and dorsalis pedis pulses; no subclavian or femoral bruits Neurological:  grossly nonfocal Psych: Normal mood and affect   ASSESSMENT:    1. Elevated coronary artery calcium  score   2. Mixed hyperlipidemia   3. Statin myopathy   4. Overweight    PLAN:    In order of problems listed above:  Elevated coronary calcium  score: Asymptomatic.  Physically active.  Strong family history of CAD.  We are addressing her risk factors. Hypercholesterolemia/statin myopathy: Muscle aches documented with atorvastatin , rosuvastatin, but have a statin and she reports that she has tried other agents as well as some results.  Unfortunately she has had muscle aches with Nexletol  and even ezetimibe  and Repatha .  Excellent response to Leqvio with LDL cholesterol going down by over 60% and fully in target range. Overweight: She is an excellent job with weight loss and is no longer obese, prediabetes has been cured.  No longer even taking metformin .          Medication Adjustments/Labs and Tests Ordered: Current medicines are  reviewed at length with the patient today.  Concerns regarding medicines are outlined above.  Orders Placed This Encounter  Procedures   EKG 12-Lead   No orders of the defined types were placed in this encounter.   Patient Instructions  Medication Instructions:  No changes *If you need a refill on your cardiac medications before your next appointment, please call your pharmacy*  Lab Work: None ordered If you have labs (blood work) drawn today and your tests are completely normal, you will receive your results only by: MyChart Message (if you have MyChart) OR A paper copy in the mail If you have any lab test that is abnormal or we need to change your treatment, we will call you to review the results.  Testing/Procedures: None ordered  Follow-Up: At [facility], you and your health needs are our priority.  As part of our continuing mission to provide you with exceptional heart care, our providers are all part of one team.  This team includes your primary Cardiologist (physician) and Advanced Practice Providers or APPs (Physician Assistants and Nurse Practitioners) who all work together to provide you with the care you need, when you need it.  Your next appointment:   1 year(s)  Provider:   Dr Francyne  We recommend signing up for the patient portal called MyChart.  Sign up information is provided on this After Visit Summary.  MyChart is used to connect with patients for Virtual Visits (Telemedicine).  Patients are able to view lab/test results, encounter notes, upcoming appointments, etc.  Non-urgent messages can be sent to your provider as well.   To learn more about what you can do with MyChart, go to [email or web address].      Signed, Jerel Francyne, MD  08/09/2024 10:08 AM    [facility]

## 2024-08-04 NOTE — Patient Instructions (Signed)

## 2024-08-07 DIAGNOSIS — L603 Nail dystrophy: Secondary | ICD-10-CM | POA: Diagnosis not present

## 2024-08-07 DIAGNOSIS — L814 Other melanin hyperpigmentation: Secondary | ICD-10-CM | POA: Diagnosis not present

## 2024-08-07 DIAGNOSIS — L821 Other seborrheic keratosis: Secondary | ICD-10-CM | POA: Diagnosis not present

## 2024-08-07 DIAGNOSIS — D1801 Hemangioma of skin and subcutaneous tissue: Secondary | ICD-10-CM | POA: Diagnosis not present

## 2024-08-09 DIAGNOSIS — E663 Overweight: Secondary | ICD-10-CM | POA: Insufficient documentation

## 2024-08-09 DIAGNOSIS — G720 Drug-induced myopathy: Secondary | ICD-10-CM | POA: Insufficient documentation

## 2024-08-12 DIAGNOSIS — Z79890 Hormone replacement therapy (postmenopausal): Secondary | ICD-10-CM | POA: Diagnosis not present

## 2024-08-12 DIAGNOSIS — N951 Menopausal and female climacteric states: Secondary | ICD-10-CM | POA: Diagnosis not present

## 2024-12-30 ENCOUNTER — Ambulatory Visit: Admitting: Obstetrics and Gynecology
